# Patient Record
Sex: Male | Born: 1970 | Race: White | Hispanic: No | Marital: Single | Smoking: Current every day smoker
Health system: Southern US, Community
[De-identification: ages and names within clinical notes are randomized; demographics above are authoritative.]

## PROBLEM LIST (undated history)

## (undated) DIAGNOSIS — F1020 Alcohol dependence, uncomplicated: Secondary | ICD-10-CM

## (undated) DIAGNOSIS — F1910 Other psychoactive substance abuse, uncomplicated: Secondary | ICD-10-CM

---

## 2005-02-15 ENCOUNTER — Emergency Department (HOSPITAL_COMMUNITY): Admission: EM | Admit: 2005-02-15 | Discharge: 2005-02-16 | Payer: Self-pay | Admitting: Emergency Medicine

## 2010-01-22 ENCOUNTER — Emergency Department (HOSPITAL_COMMUNITY): Admission: EM | Admit: 2010-01-22 | Discharge: 2010-01-22 | Payer: Self-pay | Admitting: Emergency Medicine

## 2010-03-14 ENCOUNTER — Emergency Department (HOSPITAL_COMMUNITY)
Admission: EM | Admit: 2010-03-14 | Discharge: 2010-03-14 | Payer: Self-pay | Source: Home / Self Care | Admitting: Emergency Medicine

## 2010-05-07 ENCOUNTER — Emergency Department (HOSPITAL_COMMUNITY)
Admission: EM | Admit: 2010-05-07 | Discharge: 2010-05-08 | Payer: Self-pay | Source: Home / Self Care | Admitting: Emergency Medicine

## 2010-05-07 LAB — RAPID URINE DRUG SCREEN, HOSP PERFORMED
Barbiturates: NOT DETECTED
Opiates: NOT DETECTED

## 2010-05-07 LAB — CBC
MCH: 32.1 pg (ref 26.0–34.0)
MCHC: 34.6 g/dL (ref 30.0–36.0)
MCV: 92.8 fL (ref 78.0–100.0)
Platelets: 287 10*3/uL (ref 150–400)
RDW: 13.7 % (ref 11.5–15.5)
WBC: 10.5 10*3/uL (ref 4.0–10.5)

## 2010-05-07 LAB — COMPREHENSIVE METABOLIC PANEL
Albumin: 4.3 g/dL (ref 3.5–5.2)
BUN: 8 mg/dL (ref 6–23)
Calcium: 9.2 mg/dL (ref 8.4–10.5)
Creatinine, Ser: 0.89 mg/dL (ref 0.4–1.5)
Glucose, Bld: 121 mg/dL — ABNORMAL HIGH (ref 70–99)
Potassium: 4.3 mEq/L (ref 3.5–5.1)
Total Protein: 7.4 g/dL (ref 6.0–8.3)

## 2010-05-07 LAB — DIFFERENTIAL
Eosinophils Absolute: 0.2 10*3/uL (ref 0.0–0.7)
Eosinophils Relative: 2 % (ref 0–5)
Lymphs Abs: 2.8 10*3/uL (ref 0.7–4.0)
Monocytes Absolute: 0.4 10*3/uL (ref 0.1–1.0)

## 2010-05-07 LAB — CK: Total CK: 160 U/L (ref 7–232)

## 2010-05-07 LAB — SALICYLATE LEVEL: Salicylate Lvl: <4 mg/dL (ref 2.8–20.0)

## 2010-05-07 LAB — ACETAMINOPHEN LEVEL: Acetaminophen (Tylenol), Serum: <10 ug/mL — ABNORMAL LOW (ref 10–30)

## 2010-05-07 LAB — ETHANOL: Alcohol, Ethyl (B): <5 mg/dL (ref 0–10)

## 2010-12-12 ENCOUNTER — Emergency Department (HOSPITAL_COMMUNITY)
Admission: EM | Admit: 2010-12-12 | Discharge: 2010-12-12 | Disposition: A | Discharge status: Home / Self Care | Payer: Self-pay | Attending: Emergency Medicine | Admitting: Emergency Medicine

## 2010-12-12 DIAGNOSIS — K029 Dental caries, unspecified: Secondary | ICD-10-CM | POA: Insufficient documentation

## 2010-12-12 DIAGNOSIS — K089 Disorder of teeth and supporting structures, unspecified: Secondary | ICD-10-CM | POA: Insufficient documentation

## 2011-09-15 ENCOUNTER — Emergency Department: Payer: Self-pay | Admitting: *Deleted

## 2018-08-11 DIAGNOSIS — K8020 Calculus of gallbladder without cholecystitis without obstruction: Secondary | ICD-10-CM

## 2018-08-11 DIAGNOSIS — R188 Other ascites: Secondary | ICD-10-CM

## 2018-08-11 DIAGNOSIS — K7460 Unspecified cirrhosis of liver: Secondary | ICD-10-CM

## 2018-08-11 DIAGNOSIS — K759 Inflammatory liver disease, unspecified: Secondary | ICD-10-CM

## 2018-08-11 DIAGNOSIS — R17 Unspecified jaundice: Secondary | ICD-10-CM

## 2018-08-12 ENCOUNTER — Encounter (HOSPITAL_COMMUNITY): Payer: Self-pay | Admitting: Internal Medicine

## 2018-08-12 ENCOUNTER — Inpatient Hospital Stay (HOSPITAL_COMMUNITY)
Admission: EM | Admit: 2018-08-12 | Discharge: 2018-09-08 | DRG: 433 | Disposition: E | Payer: Medicaid Other | Source: Other Acute Inpatient Hospital | Attending: Family Medicine | Admitting: Family Medicine

## 2018-08-12 DIAGNOSIS — B1920 Unspecified viral hepatitis C without hepatic coma: Secondary | ICD-10-CM | POA: Diagnosis present

## 2018-08-12 DIAGNOSIS — E871 Hypo-osmolality and hyponatremia: Secondary | ICD-10-CM | POA: Diagnosis present

## 2018-08-12 DIAGNOSIS — D509 Iron deficiency anemia, unspecified: Secondary | ICD-10-CM | POA: Diagnosis not present

## 2018-08-12 DIAGNOSIS — Z781 Physical restraint status: Secondary | ICD-10-CM

## 2018-08-12 DIAGNOSIS — F1210 Cannabis abuse, uncomplicated: Secondary | ICD-10-CM | POA: Diagnosis present

## 2018-08-12 DIAGNOSIS — R188 Other ascites: Secondary | ICD-10-CM

## 2018-08-12 DIAGNOSIS — R109 Unspecified abdominal pain: Secondary | ICD-10-CM | POA: Insufficient documentation

## 2018-08-12 DIAGNOSIS — E8770 Fluid overload, unspecified: Secondary | ICD-10-CM | POA: Diagnosis not present

## 2018-08-12 DIAGNOSIS — F1510 Other stimulant abuse, uncomplicated: Secondary | ICD-10-CM | POA: Diagnosis present

## 2018-08-12 DIAGNOSIS — K7200 Acute and subacute hepatic failure without coma: Secondary | ICD-10-CM | POA: Diagnosis present

## 2018-08-12 DIAGNOSIS — F1021 Alcohol dependence, in remission: Secondary | ICD-10-CM | POA: Diagnosis present

## 2018-08-12 DIAGNOSIS — B169 Acute hepatitis B without delta-agent and without hepatic coma: Secondary | ICD-10-CM | POA: Diagnosis present

## 2018-08-12 DIAGNOSIS — K8020 Calculus of gallbladder without cholecystitis without obstruction: Secondary | ICD-10-CM | POA: Diagnosis present

## 2018-08-12 DIAGNOSIS — Z20828 Contact with and (suspected) exposure to other viral communicable diseases: Secondary | ICD-10-CM | POA: Diagnosis not present

## 2018-08-12 DIAGNOSIS — Z66 Do not resuscitate: Secondary | ICD-10-CM | POA: Diagnosis not present

## 2018-08-12 DIAGNOSIS — R17 Unspecified jaundice: Secondary | ICD-10-CM | POA: Diagnosis present

## 2018-08-12 DIAGNOSIS — D689 Coagulation defect, unspecified: Secondary | ICD-10-CM | POA: Diagnosis not present

## 2018-08-12 DIAGNOSIS — R1084 Generalized abdominal pain: Secondary | ICD-10-CM

## 2018-08-12 DIAGNOSIS — K7031 Alcoholic cirrhosis of liver with ascites: Principal | ICD-10-CM | POA: Diagnosis present

## 2018-08-12 DIAGNOSIS — K7040 Alcoholic hepatic failure without coma: Secondary | ICD-10-CM | POA: Diagnosis not present

## 2018-08-12 DIAGNOSIS — F1910 Other psychoactive substance abuse, uncomplicated: Secondary | ICD-10-CM

## 2018-08-12 DIAGNOSIS — F17210 Nicotine dependence, cigarettes, uncomplicated: Secondary | ICD-10-CM | POA: Diagnosis not present

## 2018-08-12 DIAGNOSIS — Z515 Encounter for palliative care: Secondary | ICD-10-CM | POA: Diagnosis not present

## 2018-08-12 DIAGNOSIS — K920 Hematemesis: Secondary | ICD-10-CM

## 2018-08-12 DIAGNOSIS — D892 Hypergammaglobulinemia, unspecified: Secondary | ICD-10-CM | POA: Diagnosis present

## 2018-08-12 DIAGNOSIS — K7290 Hepatic failure, unspecified without coma: Secondary | ICD-10-CM

## 2018-08-12 DIAGNOSIS — K5900 Constipation, unspecified: Secondary | ICD-10-CM | POA: Diagnosis not present

## 2018-08-12 DIAGNOSIS — R7989 Other specified abnormal findings of blood chemistry: Secondary | ICD-10-CM | POA: Diagnosis present

## 2018-08-12 DIAGNOSIS — R945 Abnormal results of liver function studies: Secondary | ICD-10-CM

## 2018-08-12 DIAGNOSIS — F17200 Nicotine dependence, unspecified, uncomplicated: Secondary | ICD-10-CM | POA: Diagnosis present

## 2018-08-12 HISTORY — DX: Alcohol dependence, uncomplicated: F10.20

## 2018-08-12 HISTORY — DX: Other psychoactive substance abuse, uncomplicated: F19.10

## 2018-08-12 LAB — IRON AND TIBC
Iron: 25 ug/dL — ABNORMAL LOW (ref 45–182)
Saturation Ratios: 7 % — ABNORMAL LOW (ref 17.9–39.5)
TIBC: 356 ug/dL (ref 250–450)
UIBC: 331 ug/dL

## 2018-08-12 LAB — CBC WITH DIFFERENTIAL/PLATELET
Abs Immature Granulocytes: 0.05 10*3/uL (ref 0.00–0.07)
Basophils Absolute: 0 10*3/uL (ref 0.0–0.1)
Basophils Relative: 0 %
Eosinophils Absolute: 0.1 10*3/uL (ref 0.0–0.5)
Eosinophils Relative: 2 %
HCT: 29.7 % — ABNORMAL LOW (ref 39.0–52.0)
Hemoglobin: 9.7 g/dL — ABNORMAL LOW (ref 13.0–17.0)
Immature Granulocytes: 1 %
Lymphocytes Relative: 21 %
Lymphs Abs: 1.4 10*3/uL (ref 0.7–4.0)
MCH: 24 pg — ABNORMAL LOW (ref 26.0–34.0)
MCHC: 32.7 g/dL (ref 30.0–36.0)
MCV: 73.3 fL — ABNORMAL LOW (ref 80.0–100.0)
Monocytes Absolute: 0.7 10*3/uL (ref 0.1–1.0)
Monocytes Relative: 11 %
Neutro Abs: 4.4 10*3/uL (ref 1.7–7.7)
Neutrophils Relative %: 65 %
Platelets: 329 10*3/uL (ref 150–400)
RBC: 4.05 MIL/uL — ABNORMAL LOW (ref 4.22–5.81)
RDW: 29.2 % — ABNORMAL HIGH (ref 11.5–15.5)
WBC: 6.7 10*3/uL (ref 4.0–10.5)
nRBC: 0 % (ref 0.0–0.2)

## 2018-08-12 LAB — VITAMIN B12: Vitamin B-12: 4716 pg/mL — ABNORMAL HIGH (ref 180–914)

## 2018-08-12 LAB — SARS CORONAVIRUS 2 BY RT PCR (HOSPITAL ORDER, PERFORMED IN ~~LOC~~ HOSPITAL LAB): SARS Coronavirus 2: NEGATIVE

## 2018-08-12 LAB — COMPREHENSIVE METABOLIC PANEL
ALT: 846 U/L — ABNORMAL HIGH (ref 0–44)
AST: 2005 U/L — ABNORMAL HIGH (ref 15–41)
Albumin: 1.9 g/dL — ABNORMAL LOW (ref 3.5–5.0)
Alkaline Phosphatase: 139 U/L — ABNORMAL HIGH (ref 38–126)
Anion gap: 6 (ref 5–15)
BUN: 10 mg/dL (ref 6–20)
CO2: 20 mmol/L — ABNORMAL LOW (ref 22–32)
Calcium: 7.9 mg/dL — ABNORMAL LOW (ref 8.9–10.3)
Chloride: 102 mmol/L (ref 98–111)
Creatinine, Ser: 0.61 mg/dL (ref 0.61–1.24)
GFR calc Af Amer: >60 mL/min (ref >60)
GFR calc non Af Amer: >60 mL/min (ref >60)
Glucose, Bld: 78 mg/dL (ref 70–99)
Potassium: 3.7 mmol/L (ref 3.5–5.1)
Sodium: 128 mmol/L — ABNORMAL LOW (ref 135–145)
Total Bilirubin: 24.8 mg/dL (ref 0.3–1.2)
Total Protein: 8.4 g/dL — ABNORMAL HIGH (ref 6.5–8.1)

## 2018-08-12 LAB — RETICULOCYTES
Immature Retic Fract: 23.3 % — ABNORMAL HIGH (ref 2.3–15.9)
RBC.: 3.96 MIL/uL — ABNORMAL LOW (ref 4.22–5.81)
Retic Count, Absolute: 177 10*3/uL (ref 19.0–186.0)
Retic Ct Pct: 4.5 % — ABNORMAL HIGH (ref 0.4–3.1)

## 2018-08-12 LAB — AMMONIA: Ammonia: 51 umol/L — ABNORMAL HIGH (ref 9–35)

## 2018-08-12 LAB — FOLATE: Folate: 14 ng/mL (ref >5.9)

## 2018-08-12 LAB — PROTIME-INR
INR: 3.2 — ABNORMAL HIGH (ref 0.8–1.2)
Prothrombin Time: 32.6 seconds — ABNORMAL HIGH (ref 11.4–15.2)

## 2018-08-12 LAB — FERRITIN: Ferritin: 26 ng/mL (ref 24–336)

## 2018-08-12 MED ORDER — MORPHINE SULFATE (PF) 2 MG/ML IV SOLN
2.0000 mg | INTRAVENOUS | Status: DC | PRN
  Administered 2018-08-12 – 2018-08-13 (×6): 2 mg via INTRAVENOUS
  Filled 2018-08-12 (×6): qty 1

## 2018-08-12 MED ORDER — SODIUM CHLORIDE 0.9 % IV SOLN
1.0000 g | INTRAVENOUS | Status: DC
  Administered 2018-08-12 – 2018-08-13 (×2): 1 g via INTRAVENOUS
  Filled 2018-08-12 (×2): qty 10

## 2018-08-12 MED ORDER — ONDANSETRON HCL 4 MG PO TABS: 4.0000 mg | ORAL_TABLET | Freq: Four times a day (QID) | ORAL | Status: DC | PRN

## 2018-08-12 MED ORDER — VITAMIN K1 10 MG/ML IJ SOLN
10.0000 mg | Freq: Once | INTRAVENOUS | Status: AC
  Administered 2018-08-12: 10 mg via INTRAVENOUS
  Filled 2018-08-12: qty 1

## 2018-08-12 MED ORDER — DOCUSATE SODIUM 100 MG PO CAPS
100.0000 mg | ORAL_CAPSULE | Freq: Two times a day (BID) | ORAL | Status: DC
  Administered 2018-08-12 – 2018-08-16 (×7): 100 mg via ORAL
  Filled 2018-08-12 (×9): qty 1

## 2018-08-12 MED ORDER — LACTULOSE 10 GM/15ML PO SOLN: 30.0000 g | Freq: Two times a day (BID) | ORAL | Status: DC | PRN

## 2018-08-12 MED ORDER — NICOTINE 14 MG/24HR TD PT24
14.0000 mg | MEDICATED_PATCH | Freq: Every day | TRANSDERMAL | Status: DC
  Administered 2018-08-12 – 2018-08-16 (×5): 14 mg via TRANSDERMAL
  Filled 2018-08-12 (×5): qty 1

## 2018-08-12 MED ORDER — ALBUMIN HUMAN 25 % IV SOLN
25.0000 g | Freq: Once | INTRAVENOUS | Status: AC
  Administered 2018-08-13: 25 g via INTRAVENOUS
  Filled 2018-08-12: qty 50

## 2018-08-12 MED ORDER — ENOXAPARIN SODIUM 40 MG/0.4ML ~~LOC~~ SOLN: 40.0000 mg | SUBCUTANEOUS | Status: DC

## 2018-08-12 MED ORDER — LACTULOSE 10 GM/15ML PO SOLN: 30.0000 g | ORAL | Status: DC

## 2018-08-12 MED ORDER — ONDANSETRON HCL 4 MG/2ML IJ SOLN
4.0000 mg | Freq: Four times a day (QID) | INTRAMUSCULAR | Status: DC | PRN
  Administered 2018-08-13 – 2018-08-15 (×3): 4 mg via INTRAVENOUS
  Filled 2018-08-12 (×3): qty 2

## 2018-08-12 NOTE — Progress Notes (Signed)
CRITICAL VALUE ALERT  Critical Value:  bilirubin total 24.8  Date & Time Notied:  Sep 06, 2018 at 10:29am  Provider Notified: MD Ophelia Charter  Orders Received/Actions taken: waiting on page

## 2018-08-12 NOTE — Consult Note (Addendum)
Referring Provider: [facility]  Primary Care Physician:  No primary care provider on file. Primary Gastroenterologist: unassigned    Reason for Consultation: decompensated cirrhosis ( new)      ASSESSMENT / PLAN:    40.  48 year old male transferred from [facility] with acute on chronic liver disease.  Ultrasound showing cirrhosis (new diagnosis for him).  Etiology of cirrhosis unclear, possibly alcohol.  Labs are pending.  Acute liver failure possibly viral, drug-induced, doubt shock liver.  Some improvement in transaminases overnight, bilirubin continues to use rise. -Tylenol level negative.  UDS positive for THC though he does smoke methamphetamines, last time was 2 weeks ago. -Await INR and acute hepatitis panel, both pending -Consider Ceruloplasmin if pending workup negative.  -I spoke with Hospitalist earlier, she will order paracentesis with fluid studies to include Gram stain, culture, albumin, total protein, cytology.  -He did present with abdominal pain, agree with empiric antibiotics for now -Mild asterixis on exam but he is alert and oriented.  Will hold off on starting lactulose given recent diarrhea.  Monitor neuro exam, monitor daily INR -eventually EGD varices screening -HCC screening: no focal lesions on U/S. Will order AFP at some point.   -U/S with dopplers of PV and HV (the one at [facility] assessed only PV)  -Can have diet from GI standpoint.   2. Microcytic Anemia, no overt GI bleeding. Hgb 9-10 range.  -obtain anemia panel.    HPI:   Mark Weeks is a 48 y.o. male transferred today from [facility] where he presented yesterday with abdominal pain, abdominal distention, nausea, vomiting, diarrhea and jaundice.   Workup at [facility]:  WBC 7.2, hemoglobin 10.1, platelets 307, MCV 75, total bilirubin 22.8, alk phos 192, AST 2534, ALT 1360, albumin 3, INR 2.7, Tylenol level less than 10, ammonia level normal.  UDS positive for THC. RUQ ultrasound  shows cirrhosis, mild perihepatic ascites, cholelithiasis, diffuse gallbladder wall thickening question chronic cholecystitis versus just being secondary to hypoalbuminemia.No biliary duct dilation. Patent PV on doppler  Patient denies history of liver disease.  He has not consumed alcohol in 6 years, no supplements/herbs at home.  No Tylenol use.  He smokes marijuana.  He smokes methamphetamines but none in 14 days he says  Patient denies any history of chronic GI symptoms.  He has never seen blood in stool, no black stools.  The GI symptoms he presented with to the ED started a few days ago.  He was not having any more than 1-2 episodes of vomiting or diarrhea a day.  He did have associated generalized abdominal pain described as intermittent.  Food no vomitus had any bearing on the pain.  Denies fevers.    PERTINENT  LABS:    Today's labs: Sodium 128, normal renal function, albumin 1.9, AST 2005, ALT 846, total bili 24.8, WBC 6.7, hemoglobin 9.7, MCV 73, platelets normal at 329   PMH: none he knows prior to this patient.. Newly diagnosed cirrhosis [place]: Family history of liver disease Home meds: none. No herbs / supplements.   Prior to Admission medications   Not on File    Current Facility-Administered Medications  Medication Dose Route Frequency Provider Last Rate Last Dose  . albumin human 25 % solution 25-50 g  25-50 g Intravenous Once Karmen Bongo, MD      . cefTRIAXone (ROCEPHIN) 1 g in sodium chloride 0.9 % 100 mL IVPB  1 g Intravenous Q24H Karmen Bongo, MD      . docusate sodium (  COLACE) capsule 100 mg  100 mg Oral BID Karmen Bongo, MD      . enoxaparin (LOVENOX) injection 40 mg  40 mg Subcutaneous Q24H Karmen Bongo, MD      . ondansetron [facility]) tablet 4 mg  4 mg Oral Q6H PRN Karmen Bongo, MD       Or  . ondansetron [facility]) injection 4 mg  4 mg Intravenous Q6H PRN Karmen Bongo, MD        Allergies as of 09/02/2018  . (Not on File)    No family  history on file.  Social History   Socioeconomic History  . Marital status: Single    Spouse name: Not on file  . Number of children: Not on file  . Years of education: Not on file  . Highest education level: Not on file  Occupational History  . Not on file  Social Needs  . Financial resource strain: Not on file  . Food insecurity:    Worry: Not on file    Inability: Not on file  . Transportation needs:    Medical: Not on file    Non-medical: Not on file  Tobacco Use  . Smoking status: Not on file  Substance and Sexual Activity  . Alcohol use: Not on file  . Drug use: Not on file  . Sexual activity: Not on file  Lifestyle  . Physical activity:    Days per week: Not on file    Minutes per session: Not on file  . Stress: Not on file  Relationships  . Social connections:    Talks on phone: Not on file    Gets together: Not on file    Attends religious service: Not on file    Active member of club or organization: Not on file    Attends meetings of clubs or organizations: Not on file    Relationship status: Not on file  . Intimate partner violence:    Fear of current or ex partner: Not on file    Emotionally abused: Not on file    Physically abused: Not on file    Forced sexual activity: Not on file  Other Topics Concern  . Not on file  Social History Narrative  . Not on file    Review of Systems: All systems reviewed and negative except where noted in HPI.  Physical Exam: Vital signs in last 24 hours: Temp:  [98.2 F (36.8 C)] 98.2 F (36.8 C) (05/05 0635) Pulse Rate:  [78] 78 (05/05 0635) Resp:  [18] 18 (05/05 0635) BP: (121)/(78) 121/78 (05/05 0635) SpO2:  [99 %] 99 % (05/05 6301)   General:   Alert, thin, jaundiced male in NAD Psych:  Pleasant, cooperative. Normal mood and affect. Eyes:  Pupils equal, icteric sclera  Ears:  Normal auditory acuity. Nose:  No deformity, discharge,  or lesions. Neck:  Supple; no masses Lungs:  Bibasilar crackles.   No  wheezes, crackles, or rhonchi.  Heart:  Regular rate and rhythm; no murmurs, 2+ BLE edema Abdomen:  Soft, mild-mod distended, nontender, BS active, no palp mass    Rectal:  Deferred  Msk:  Symmetrical without gross deformities. . Neurologic:  Alert and  oriented x4; positive asterixis otherwise grossly normal neurologically. Skin:  Intact without significant lesions or rashes.  Multiple tattoos   Intake/Output from previous day: No intake/output data recorded. Intake/Output this shift: No intake/output data recorded.  Lab Results: Recent Labs    08/21/2018 0902  WBC 6.7  HGB  9.7*  HCT 29.7*  PLT 329   BMET Recent Labs    09/05/2018 0902  NA 128*  K 3.7  CL 102  CO2 20*  GLUCOSE 78  BUN 10  CREATININE 0.61  CALCIUM 7.9*   LFT Recent Labs    09/07/2018 0902  PROT 8.4*  ALBUMIN 1.9*  AST 2,005*  ALT 846*  ALKPHOS 139*  BILITOT 24.8*   PT/INR No results for input(s): LABPROT, INR in the last 72 hours. Hepatitis Panel No results for input(s): HEPBSAG, HCVAB, HEPAIGM, HEPBIGM in the last 72 hours.   Studies/Results: No results found.   Tye Savoy, NP-C @  08/24/2018, 10:36 AM

## 2018-08-12 NOTE — Progress Notes (Signed)
Pt arrived to [address], admission paged no orders on file.

## 2018-08-12 NOTE — H&P (Addendum)
History and Physical    Mark Weeks ZOX:[id number] DOB: [birth year] DOA: 08-28-2018  PCP: No primary care provider on file.  No doctor in years. Consultants:  None Patient coming from:  Home - lives with friends; NOK: Mark Weeks, a friend, [phone]; twin sister, [phone], called to check on patient and to request an update  Chief Complaint:  Weakness  HPI: Mark Weeks is a 48 y.o. male with medical history significant of alcohol dependence, in remission, presenting to [facility] with generalized weakness.  "I just turned yellow and right here I am."  No ETOH in the last 6 years.  Occasional marijuana, denies other drugs in the last 3-4 weeks.  He was using methamphetamines prior.  He noticed jaundice about 2 weeks ago.  He is able to eat when he doesn't have pain.  He has diffuse stomach pain with bloating.  ?ascites.  No fever.  +nausea, some occasional emesis.  Diarrhea recently, two loose stools a day.  He thinks he has lost some weight.   ED Course:  RH transfer, per Dr. Mikeal Weeks:  Mark Weeks, 48 yo man with history of alcoholism who has quit now for some time presented to [facility] with generalized weakness, fatigue and has markedly elevated LFTs, US showing liver disease possibly Cirrhosis. Patient will need GI evaluation which is not available at [facility]. He will need GI consult here in am.   Review of Systems: As per HPI; otherwise review of systems reviewed and negative.   Ambulatory Status:  Ambulates without assistance  Past Medical History:  Diagnosis Date  . Alcohol dependence (HCC)    in remission x 6 years  . Polysubstance abuse (HCC)    amphetamines, marijuana    History reviewed. No pertinent surgical history.  Social History   Socioeconomic History  . Marital status: Single    Spouse name: Not on file  . Number of children: Not on file  . Years of education: Not on file  . Highest education level: Not on file  Occupational History  . Occupation: Advice worker  . Financial resource strain: Not on file  . Food insecurity:    Worry: Not on file    Inability: Not on file  . Transportation needs:    Medical: Not on file    Non-medical: Not on file  Tobacco Use  . Smoking status: Current Every Day Smoker    Packs/day: 1.00  . Smokeless tobacco: Never Used  Substance and Sexual Activity  . Alcohol use: Not Currently    Comment: h/o heavy use  . Drug use: Yes    Types: Amphetamines, Marijuana  . Sexual activity: Not on file  Lifestyle  . Physical activity:    Days per week: Not on file    Minutes per session: Not on file  . Stress: Not on file  Relationships  . Social connections:    Talks on phone: Not on file    Gets together: Not on file    Attends religious service: Not on file    Active member of club or organization: Not on file    Attends meetings of clubs or organizations: Not on file    Relationship status: Not on file  . Intimate partner violence:    Fear of current or ex partner: Not on file    Emotionally abused: Not on file    Physically abused: Not on file    Forced sexual activity: Not on file  Other Topics Concern  . Not on  file  Social History Narrative  . Not on file    Allergies not on file  History reviewed. No pertinent family history.  Prior to Admission medications   Not on File    Physical Exam: Vitals:   08/20/2018 0635 08/10/2018 1234  BP: 121/78   Pulse: 78   Resp: 18   Temp: 98.2 F (36.8 C)   TempSrc: Oral   SpO2: 99%   Weight:  79.5 kg     . General:  Appears calm and comfortable and is NAD; he is extremely jaundiced . Eyes:   EOMI, normal lids, iris; marked scleral icterus . ENT:  grossly normal hearing, lips & tongue, mmm . Neck:  no LAD, masses or thyromegaly . Cardiovascular:  RRR, no m/r/g. 2-3+ LE edema.  Marland Kitchen. Respiratory:   CTA bilaterally with no wheezes/rales/rhonchi.  Normal respiratory effort. . Abdomen: marked ascites; there is a bandaid on the LLQ that appears  to have been an attempt at paracentesis, but patient denies such attempt and I do not see evidence of this having been [facility] . Skin:  no rash or induration seen on limited exam; marked diffuse jaundice . Musculoskeletal:  grossly normal tone BUE/BLE, good ROM, no bony abnormality . Psychiatric:  blunted mood and affect, speech fluent and appropriate, AOx3 . Neurologic:  CN 2-12 grossly intact, moves all extremities in coordinated fashion, sensation intact    Radiological Exams on Admission: No results found.    Labs on Admission: I have personally reviewed the available labs and imaging studies at the time of the admission.  Pertinent labs:  Na++ 128 BUN 10/Creatinine 0.61/GFR >60 AP 139 Albumin 1.9 AST 2005/ALT 846 Bili 24.8 NH 51 WBC 6.7 Hgb 9.7 INR 3.2  [facility]:  WBC 7.2 Hgb 10.1 Platelets 307 Na++ 129 K+ 3.2 Glucose 107 AST 2534 ALT 1060 AP 192 Bili 22.8 (total) TP 9.4 Albumin 3.0 INR 2.7 Lactate 3.1, 3.0 NH4 29 UDS + for THC RUQ US: cirrhosis with ascites; cholelithiasis; no ductal diltation    Assessment/Plan Principal Problem:   Acute hepatic failure Active Problems:   Ascites   Coagulopathy (HCC)   Polysubstance abuse (HCC)   Tobacco dependence   -Patient with prior h/o ETOH dependence and persistent h/o polysubstance abuse (acknowledges amphetamines and marijuana) presenting with progressive jaundice and abdominal pain -Clearly has ascites -[facility] found to have bili elevated to 22.8, currently 24.8 -Other LFTs also elevated -Ammonia mildly elevated; will add lactulose titrated to 2-3 soft stools/day, as he reports intermittent confusion concerning for mild encephalopathy -His hepatitis discriminant function indicates that prednisolone may improve survival; will defer to GI -MELD/MELD-Na score is 32/34, with a mortality rate of 52.6% -GI has been consulted -IR has been consulted for paracentesis with  labs including cytology ordered -Additional screening and staging labs have been ordered -Will give IV Vitamin K, as suggested by GI -Rocephin for empiric coverage of SBP for now -Tobacco Dependence: encourage cessation.    -Nicotine patch ordered -Obviously needs cessation from all substances.    Note: This patient has been tested and his test is pending for the novel coronavirus COVID-19.  DVT prophylaxis:   SCDs Code Status:  DNR - confirmed with patient Family Communication:  None present Disposition Plan:  Home once clinically improved Consults called: GI  Admission status: Admit - It is my clinical opinion that admission to INPATIENT is reasonable and necessary because of the expectation that this patient will require  hospital care that crosses at least 2 midnights to treat this condition based on the medical complexity of the problems presented.  Given the aforementioned information, the predictability of an adverse outcome is felt to be significant.     Jonah Blue MD [facility]   How to contact the [facility] Attending or Consulting provider 7A - 7P or covering provider during after hours 7P -7A, for this patient?  1. Check the care team in [facility] (1-Rh) and look for a) attending/consulting [facility] provider listed and b) the [facility] team listed 2. Log into [email or web address] and use [place]'s universal password to access. If you do not have the password, please contact the hospital operator. 3. Locate the [facility] provider you are looking for under [facility] and page to a number that you can be directly reached. 4. If you still have difficulty reaching the provider, please page the [facility] (Director on Call) for the Hospitalists listed on amion for assistance.   2018/09/10, 2:00 PM

## 2018-08-13 ENCOUNTER — Other Ambulatory Visit: Payer: Self-pay

## 2018-08-13 ENCOUNTER — Inpatient Hospital Stay (HOSPITAL_COMMUNITY): Payer: Medicaid Other

## 2018-08-13 ENCOUNTER — Encounter (HOSPITAL_COMMUNITY): Payer: Self-pay | Admitting: Physician Assistant

## 2018-08-13 HISTORY — PX: IR PARACENTESIS: IMG2679

## 2018-08-13 LAB — BODY FLUID CELL COUNT WITH DIFFERENTIAL
Lymphs, Fluid: 33 %
Monocyte-Macrophage-Serous Fluid: 59 % (ref 50–90)
Neutrophil Count, Fluid: 8 % (ref 0–25)
Total Nucleated Cell Count, Fluid: 146 cu mm (ref 0–1000)

## 2018-08-13 LAB — IGG, IGA, IGM
IgA: 738 mg/dL — ABNORMAL HIGH (ref 90–386)
IgG (Immunoglobin G), Serum: 4010 mg/dL — ABNORMAL HIGH (ref 603–1613)
IgM (Immunoglobulin M), Srm: 801 mg/dL — ABNORMAL HIGH (ref 20–172)

## 2018-08-13 LAB — CBC
HCT: 27.7 % — ABNORMAL LOW (ref 39.0–52.0)
Hemoglobin: 9.1 g/dL — ABNORMAL LOW (ref 13.0–17.0)
MCH: 23.8 pg — ABNORMAL LOW (ref 26.0–34.0)
MCHC: 32.9 g/dL (ref 30.0–36.0)
MCV: 72.3 fL — ABNORMAL LOW (ref 80.0–100.0)
Platelets: 301 10*3/uL (ref 150–400)
RBC: 3.83 MIL/uL — ABNORMAL LOW (ref 4.22–5.81)
RDW: 29.6 % — ABNORMAL HIGH (ref 11.5–15.5)
WBC: 6.9 10*3/uL (ref 4.0–10.5)
nRBC: 0 % (ref 0.0–0.2)

## 2018-08-13 LAB — BASIC METABOLIC PANEL
Anion gap: 8 (ref 5–15)
BUN: 14 mg/dL (ref 6–20)
CO2: 19 mmol/L — ABNORMAL LOW (ref 22–32)
Calcium: 7.9 mg/dL — ABNORMAL LOW (ref 8.9–10.3)
Chloride: 98 mmol/L (ref 98–111)
Creatinine, Ser: 0.66 mg/dL (ref 0.61–1.24)
GFR calc Af Amer: >60 mL/min (ref >60)
GFR calc non Af Amer: >60 mL/min (ref >60)
Glucose, Bld: 101 mg/dL — ABNORMAL HIGH (ref 70–99)
Potassium: 4.2 mmol/L (ref 3.5–5.1)
Sodium: 125 mmol/L — ABNORMAL LOW (ref 135–145)

## 2018-08-13 LAB — EPSTEIN-BARR VIRUS VCA, IGM: EBV VCA IgM: 154 U/mL — ABNORMAL HIGH (ref 0.0–35.9)

## 2018-08-13 LAB — HEPATIC FUNCTION PANEL
ALT: 731 U/L — ABNORMAL HIGH (ref 0–44)
AST: 1791 U/L — ABNORMAL HIGH (ref 15–41)
Albumin: 1.8 g/dL — ABNORMAL LOW (ref 3.5–5.0)
Alkaline Phosphatase: 131 U/L — ABNORMAL HIGH (ref 38–126)
Bilirubin, Direct: 14.4 mg/dL — ABNORMAL HIGH (ref 0.0–0.2)
Indirect Bilirubin: 9.3 mg/dL — ABNORMAL HIGH (ref 0.3–0.9)
Total Bilirubin: 23.7 mg/dL (ref 0.3–1.2)
Total Protein: 8.2 g/dL — ABNORMAL HIGH (ref 6.5–8.1)

## 2018-08-13 LAB — HEPATITIS PANEL, ACUTE
HCV Ab: >11 s/co ratio — ABNORMAL HIGH (ref 0.0–0.9)
Hep A IgM: NEGATIVE
Hep B C IgM: POSITIVE — AB
Hepatitis B Surface Ag: POSITIVE — AB

## 2018-08-13 LAB — GRAM STAIN

## 2018-08-13 LAB — HIV ANTIBODY (ROUTINE TESTING W REFLEX): HIV Screen 4th Generation wRfx: NONREACTIVE

## 2018-08-13 LAB — CMV IGM: CMV IgM: 48.8 AU/mL — ABNORMAL HIGH (ref 0.0–29.9)

## 2018-08-13 LAB — CERULOPLASMIN: Ceruloplasmin: 28.8 mg/dL (ref 16.0–31.0)

## 2018-08-13 LAB — PROTEIN, PLEURAL OR PERITONEAL FLUID: Total protein, fluid: <3 g/dL

## 2018-08-13 LAB — PROTIME-INR
INR: 3.4 — ABNORMAL HIGH (ref 0.8–1.2)
Prothrombin Time: 33.6 seconds — ABNORMAL HIGH (ref 11.4–15.2)

## 2018-08-13 LAB — HSV(HERPES SIMPLEX VRS) I + II AB-IGM: HSVI/II Comb IgM: 1.8 Ratio — ABNORMAL HIGH (ref 0.00–0.90)

## 2018-08-13 LAB — ALBUMIN, PLEURAL OR PERITONEAL FLUID: Albumin, Fluid: <1 g/dL

## 2018-08-13 MED ORDER — OXYCODONE HCL 5 MG PO TABS
10.0000 mg | ORAL_TABLET | Freq: Once | ORAL | Status: AC
  Administered 2018-08-13: 10 mg via ORAL
  Filled 2018-08-13: qty 2

## 2018-08-13 MED ORDER — VITAMIN K1 10 MG/ML IJ SOLN
10.0000 mg | Freq: Once | INTRAVENOUS | Status: AC
  Administered 2018-08-13: 10 mg via INTRAVENOUS
  Filled 2018-08-13: qty 1

## 2018-08-13 MED ORDER — LIDOCAINE HCL 1 % IJ SOLN
INTRAMUSCULAR | Status: AC
  Filled 2018-08-13: qty 20

## 2018-08-13 MED ORDER — TRAMADOL HCL 50 MG PO TABS
50.0000 mg | ORAL_TABLET | Freq: Two times a day (BID) | ORAL | Status: DC | PRN
  Administered 2018-08-13 – 2018-08-14 (×2): 50 mg via ORAL
  Filled 2018-08-13 (×2): qty 1

## 2018-08-13 MED ORDER — LIDOCAINE HCL (PF) 1 % IJ SOLN
INTRAMUSCULAR | Status: DC | PRN
  Administered 2018-08-13: 5 mL

## 2018-08-13 MED ORDER — SODIUM CHLORIDE 0.9 % IV SOLN
INTRAVENOUS | Status: DC | PRN
  Administered 2018-08-16: 500 mL via INTRAVENOUS

## 2018-08-13 NOTE — Progress Notes (Signed)
     Progress Note    ASSESSMENT AND PLAN:   66. 48 year old male with newly diagnosed cirrhosis with superimposed acute HBV.   Cirrhosis possibly secondary to HCV / ETOH. Liver tests improved overnight with exception of INR 3.2 >>> 3.4 -Ceruloplasmin if pending workup negative. -CMV IgM mildly elevated at 48 ( ? Cross reactivity) -HIV negative -Obtain HCV PCR  And HBV DNA quant -Continue to monitor INR -For diagnosic paracentesis with fluid studies to include Gram stain, culture, albumin, total protein, cytology.  -eventually EGD varices screening -HCC screening: no focal lesions on U/S. Will order AFP at some point.   -awaiting U/S with dopplers of PV and HV (the one at [facility] assessed only PV)  -no further diarrhea so will start low dose of lactulose as he has asterixis on exam    2. Microcytic Anemia, no overt GI bleeding. Hgb 9-10 range.  -Very possible that he is iron deficient with ferritin of 26, TIBC 356, 7% sat.  -Hgb 9.1, down from 9.7 today. No overt bleeding.  -B12 , folate normal.  -eventual EGD / colonoscopy   3. Abnormal protein electrophoresis.    SUBJECTIVE   Still has generalized abdomina pain.  Fatigue.     OBJCTIVE:     Vital signs in last 24 hours: Temp:  [97.6 F (36.4 C)-98.2 F (36.8 C)] 98.2 F (36.8 C) (05/06 0455) Pulse Rate:  [84-87] 84 (05/06 0455) Resp:  [18] 18 (05/05 1432) BP: (105-128)/(58-78) 128/77 (05/06 0455) SpO2:  [95 %-97 %] 97 % (05/06 0455) Weight:  [79.5 kg] 79.5 kg (05/05 1234) Last BM Date: 08/18/18 General:   Alert male in NAD Heart:  Regular rate and rhythm; no murmur, 1+ BLE edema   Pulm: Normal respiratory effor Abdomen:  Soft, nondistended, nontender.  Normal bowel sounds       Neurologic:  Alert and  oriented x4;  + asterixis. Psych:  cooperative.  Normal mood and affect.   Intake/Output from previous day: 05/05 0701 - 05/06 0700 In: 390 [P.O.:240; IV Piggyback:150] Out: 650 [Urine:650] Intake/Output  this shift: Total I/O In: -  Out: 200 [Urine:200]  Lab Results: Recent Labs    2018/08/19 0902 08/13/18 0306  WBC 6.7 6.9  HGB 9.7* 9.1*  HCT 29.7* 27.7*  PLT 329 301   BMET Recent Labs    08-19-18 0902 08/13/18 0306  NA 128* 125*  K 3.7 4.2  CL 102 98  CO2 20* 19*  GLUCOSE 78 101*  BUN 10 14  CREATININE 0.61 0.66  CALCIUM 7.9* 7.9*   LFT Recent Labs    08/13/18 0306  PROT 8.2*  ALBUMIN 1.8*  AST 1,791*  ALT 731*  ALKPHOS 131*  BILITOT 23.7*  BILIDIR 14.4*  IBILI 9.3*   PT/INR Recent Labs    08/19/18 1040 08/13/18 0306  LABPROT 32.6* 33.6*  INR 3.2* 3.4*   Hepatitis Panel Recent Labs    2018/08/19 0902  HEPBSAG Positive*  HCVAB >11.0*  HEPAIGM Negative  HEPBIGM Positive*    No results found.  Principal Problem:   Acute hepatic failure Active Problems:   Elevated LFTs   Ascites   Coagulopathy (HCC)   Polysubstance abuse (HCC)   Tobacco dependence     LOS: 1 day   Willette Cluster ,NP 08/13/2018, 9:15 AM

## 2018-08-13 NOTE — Procedures (Signed)
PROCEDURE SUMMARY:  Successful US guided paracentesis from left lateral abdomen.  Yielded 3.75 L of clear yellow fluid.  No immediate complications.  Patient tolerated well.  EBL = trace  Specimen was sent for labs.  Sione Baumgarten S Raelynne Ludwick PA-C 08/13/2018 12:02 PM

## 2018-08-13 NOTE — Progress Notes (Signed)
PROGRESS NOTE    Mark EasternJohnny Weeks  WUJ:[id number] DOB: Dec 13, 1970 DOA: 08/11/2018 PCP: No primary care provider on file.      Brief Narrative:  Mr. Mark Weeks is a 48 y.o. M with hx EtOH abuse and IVDU who presents with 1-2 weeks progressive jaundice and abdominal swelling.      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  Also, Hep C Ab positive, Hep B SAg positive.  CMV IgM positive.  HIV negative.  Wilson's ruled out.  Paraproteinemia, GI are working up concurrent autoimmune hepatitis.    MELD extremely high, 34.  Paracentesis today with few nucleated cells. -Stop ceftriaxone -Follow up SPEP, FLC assay -Consult GI, appreciate cares   Coagulopathy INR increased today -Repeat vitamin K  Hepatic encephalopathy Mild slowing today. -Continue lactulose  Microcytic anemia Ferritin low.  Suspect chronic GI blood loss.  No clinical bleeding. -Start oral iron  Hepatitis B infection -Consult GI, they may start entecavir  Hepatitis C Ab positive -Follow Hep C RNA  Substance use disorder HIV negative  Hyponatremia Stable, hypervolemic.   SARS-CoV-2 testing was obtained for screening purposes.  COVID ruled out.         MDM and disposition: The below labs and imaging reports were reviewed and summarized above.  Medication management as above.  The patient was admitted with decompensated hepatic cirrhosis.  This is an acute illness and decompensation of his chronic disease that poses threat to life, with approximately 50% 6 month mortality.          DVT prophylaxis: SCDs Code Status: FULL Family Communication: None     Consultants:   GI  IR  Procedures:   Paracentesis 5/6  Antimicrobials:   Ceftriaxone 5/5>>5/6    Subjective: Abdominal pain, diffuse more on left.  No vomiting.  Jaundice severe.  No bleeding or hematochezia.  No melena. No passing out,  syncope, dyspnea.  No chest pain.    Objective: Vitals:   2018/05/13 1432 2018/05/13 2128 08/13/18 0455 08/13/18 1239  BP: (!) 105/58 124/78 128/77 113/71  Pulse: 87 85 84 89  Resp: 18   18  Temp: 97.7 F (36.5 C) 97.6 F (36.4 C) 98.2 F (36.8 C) 98.2 F (36.8 C)  TempSrc: Oral Oral Oral Oral  SpO2: 96% 95% 97% 94%  Weight:        Intake/Output Summary (Last 24 hours) at 08/13/2018 1541 Last data filed at 08/13/2018 1500 Gross per 24 hour  Intake 390 ml  Output 425 ml  Net -35 ml   Filed Weights   2018/05/13 1234  Weight: 79.5 kg    Examination: General appearance:  adult male, alert and in no acute distress.  Appears tired, sitting in bed HEENT: Anicteric, conjunctiva pink, lids and lashes normal. No nasal deformity, discharge, epistaxis.  Lips dry, dentition poor, OP very dry, no oral lesions, hearing normal.   Skin: Warm and dry.  Marked jaundice.  No suspicious rashes or lesions. Cardiac: RRR, nl S1-S2, no murmurs appreciated.  Capillary refill is brisk.  JVP not visible.  No LE edema.  Radial pulses 2+ and symmetric. Respiratory: Normal respiratory rate and rhythm.  CTAB without rales or wheezes. Abdomen: Abdomen soft.  Mild left sided TTP without gaurding.  Ascitser improved.   MSK: No deformities or effusions. Neuro: Awake and alert.  EOMI, moves all extremities. Speech fluent.    Psych: Sensorium intact and responding to questions, attention diminihsed, mild  psychomotor slowing.  No asterexis.   Affect blunted.  Judgment and insight appear mildly impaired.    Data Reviewed: I have personally reviewed following labs and imaging studies:  CBC: Recent Labs  Lab 2018/08/26 0902 08/13/18 0306  WBC 6.7 6.9  NEUTROABS 4.4  --   HGB 9.7* 9.1*  HCT 29.7* 27.7*  MCV 73.3* 72.3*  PLT 329 301   Basic Metabolic Panel: Recent Labs  Lab Aug 26, 2018 0902 08/13/18 0306  NA 128* 125*  K 3.7 4.2  CL 102 98  CO2 20* 19*  GLUCOSE 78 101*  BUN 10 14  CREATININE 0.61 0.66   CALCIUM 7.9* 7.9*   GFR: CrCl cannot be calculated (Unknown ideal weight.). Liver Function Tests: Recent Labs  Lab Aug 26, 2018 0902 08/13/18 0306  AST 2,005* 1,791*  ALT 846* 731*  ALKPHOS 139* 131*  BILITOT 24.8* 23.7*  PROT 8.4* 8.2*  ALBUMIN 1.9* 1.8*   No results for input(s): LIPASE, AMYLASE in the last 168 hours. Recent Labs  Lab 08/26/2018 1040  AMMONIA 51*   Coagulation Profile: Recent Labs  Lab 2018-08-26 1040 08/13/18 0306  INR 3.2* 3.4*   Cardiac Enzymes: No results for input(s): CKTOTAL, CKMB, CKMBINDEX, TROPONINI in the last 168 hours. BNP (last 3 results) No results for input(s): PROBNP in the last 8760 hours. HbA1C: No results for input(s): HGBA1C in the last 72 hours. CBG: No results for input(s): GLUCAP in the last 168 hours. Lipid Profile: No results for input(s): CHOL, HDL, LDLCALC, TRIG, CHOLHDL, LDLDIRECT in the last 72 hours. Thyroid Function Tests: No results for input(s): TSH, T4TOTAL, FREET4, T3FREE, THYROIDAB in the last 72 hours. Anemia Panel: Recent Labs    2018/08/26 1149  VITAMINB12 4,716*  FOLATE 14.0  FERRITIN 26  TIBC 356  IRON 25*  RETICCTPCT 4.5*   Urine analysis: No results found for: COLORURINE, APPEARANCEUR, LABSPEC, PHURINE, GLUCOSEU, HGBUR, BILIRUBINUR, KETONESUR, PROTEINUR, UROBILINOGEN, NITRITE, LEUKOCYTESUR Sepsis Labs: (procalcitonin:4,lacticacidven:4)  ) Recent Results (from the past 240 hour(s))  SARS Coronavirus 2 (CEPHEID - Performed in [facility] hospital lab), Hosp Order     Status: None   Collection Time: 08-26-18  7:56 AM  Result Value Ref Range Status   SARS Coronavirus 2 NEGATIVE NEGATIVE Final    Comment: (NOTE) If result is NEGATIVE SARS-CoV-2 target nucleic acids are NOT DETECTED. The SARS-CoV-2 RNA is generally detectable in upper and lower  respiratory specimens during the acute phase of infection. The lowest  concentration of SARS-CoV-2 viral copies this assay can detect is 250  copies  / mL. A negative result does not preclude SARS-CoV-2 infection  and should not be used as the sole basis for treatment or other  patient management decisions.  A negative result may occur with  improper specimen collection / handling, submission of specimen other  than nasopharyngeal swab, presence of viral mutation(s) within the  areas targeted by this assay, and inadequate number of viral copies  (<250 copies / mL). A negative result must be combined with clinical  observations, patient history, and epidemiological information. If result is POSITIVE SARS-CoV-2 target nucleic acids are DETECTED. The SARS-CoV-2 RNA is generally detectable in upper and lower  respiratory specimens dur ing the acute phase of infection.  Positive  results are indicative of active infection with SARS-CoV-2.  Clinical  correlation with patient history and other diagnostic information is  necessary to determine patient infection status.  Positive results do  not rule out bacterial infection or co-infection with other viruses. If result is  PRESUMPTIVE POSTIVE SARS-CoV-2 nucleic acids MAY BE PRESENT.   A presumptive positive result was obtained on the submitted specimen  and confirmed on repeat testing.  While 2019 novel coronavirus  (SARS-CoV-2) nucleic acids may be present in the submitted sample  additional confirmatory testing may be necessary for epidemiological  and / or clinical management purposes  to differentiate between  SARS-CoV-2 and other Sarbecovirus currently known to infect humans.  If clinically indicated additional testing with an alternate test  methodology [phone]) is advised. The SARS-CoV-2 RNA is generally  detectable in upper and lower respiratory sp ecimens during the acute  phase of infection. The expected result is Negative. Fact Sheet for Patients:  BoilerBrush.com.cy Fact Sheet for Healthcare Providers: https://pope.com/ This test  is not yet approved or cleared by the [place] FDA and has been authorized for detection and/or diagnosis of SARS-CoV-2 by FDA under an Emergency Use Authorization (EUA).  This EUA will remain in effect (meaning this test can be used) for the duration of the COVID-19 declaration under Section 564(b)(1) of the Act, 21 U.S.C. section 360bbb-3(b)(1), unless the authorization is terminated or revoked sooner. Performed at [facility], [address] [place], [place] [address]   Gram stain     Status: None   Collection Time: 08/13/18 11:29 AM  Result Value Ref Range Status   Specimen Description PERITONEAL CAVITY  Final   Special Requests NONE  Final   Gram Stain   Final    CYTOSPIN SMEAR WBC PRESENT, PREDOMINANTLY MONONUCLEAR NO ORGANISMS SEEN Performed at [facility], [address] [place], [place] [address]    Report Status 08/13/2018 FINAL  Final         Radiology Studies: US Liver Doppler  Result Date: 08/13/2018 CLINICAL DATA:  Cirrhosis, ascites and hepatic failure. EXAM: DUPLEX ULTRASOUND OF LIVER TECHNIQUE: Color and duplex Doppler ultrasound was performed to evaluate the hepatic in-flow and out-flow vessels. COMPARISON:  Right upper quadrant abdominal ultrasound on 08/11/2018 at [facility] FINDINGS: Portal Vein Velocities Main:  30 cm/sec Right:  19 cm/sec Left:  24 cm/sec There is no evidence of portal vein thrombus. Direction of portal vein flow is in a normal direction towards the liver. Waveforms are unremarkable and there is no significant reduction in portal vein velocities. Hepatic Vein Velocities Right:  51 cm/sec Middle:  43 cm/sec Left:  58 cm/sec No evidence of hepatic veno-occlusive disease. Hepatic vein waveforms and velocities are normal. Hepatic Artery Velocity:  186 cm/sec Splenic Vein Velocity: 73 cm/sec. No evidence of splenic vein thrombosis. Varices: No visualized varices. Ascites: Ascites present. The spleen is moderately enlarged  with estimated volume of 602 mL. The liver again demonstrates evidence of cirrhosis without focal mass or evidence of intrahepatic biliary ductal dilatation. IMPRESSION: 1. Patent portal vein with normal direction of flow and no significant reduction in velocities. 2. Normally patent hepatic veins. 3. Moderate splenomegaly with estimated volume of 602 mL. 4. Evidence of cirrhosis without evidence of hepatic mass or intrahepatic biliary ductal dilatation by ultrasound. 5. Ascites. Electronically Signed   By: Irish Lack M.D.   On: 08/13/2018 13:10   Ir Paracentesis  Result Date: 08/13/2018 INDICATION: Ascites secondary to alcoholic cirrhosis and hepatitis C infection. Request for diagnostic and therapeutic paracentesis. EXAM: ULTRASOUND GUIDED PARACENTESIS MEDICATIONS: 1% lidocaine 10 mL COMPLICATIONS: None immediate. PROCEDURE: Informed written consent was obtained from the patient after a discussion of the risks, benefits and alternatives to treatment. A timeout was performed prior to  the initiation of the procedure. Initial ultrasound scanning demonstrates a large amount of ascites within the left lower abdominal quadrant. The left lower abdomen was prepped and draped in the usual sterile fashion. 1% lidocaine with epinephrine was used for local anesthesia. Following this, a 19 gauge, 7-cm, Yueh catheter was introduced. An ultrasound image was saved for documentation purposes. The paracentesis was performed. The catheter was removed and a dressing was applied. The patient tolerated the procedure well without immediate post procedural complication. FINDINGS: A total of approximately 3.75 L of clear yellow fluid was removed. Samples were sent to the laboratory as requested by the clinical team. IMPRESSION: Successful ultrasound-guided paracentesis yielding 3.75 liters of peritoneal fluid. Read by: Corrin Parker, PA-C Electronically Signed   By: Judie Petit.  Shick M.D.   On: 08/13/2018 12:00        Scheduled Meds:  . docusate sodium  100 mg Oral BID  . lidocaine      . nicotine  14 mg Transdermal Daily   Continuous Infusions: . cefTRIAXone (ROCEPHIN)  IV 1 g (08/13/18 0848)     LOS: 1 day    Time spent: 35 minutes    Alberteen Sam, MD [facility] 08/13/2018, 3:41 PM     Please page through AMION:  [email or web address] If 7PM-7AM, please contact night-coverage

## 2018-08-14 DIAGNOSIS — R768 Other specified abnormal immunological findings in serum: Secondary | ICD-10-CM

## 2018-08-14 DIAGNOSIS — R188 Other ascites: Secondary | ICD-10-CM

## 2018-08-14 DIAGNOSIS — K7460 Unspecified cirrhosis of liver: Secondary | ICD-10-CM

## 2018-08-14 DIAGNOSIS — F1021 Alcohol dependence, in remission: Secondary | ICD-10-CM

## 2018-08-14 DIAGNOSIS — F17200 Nicotine dependence, unspecified, uncomplicated: Secondary | ICD-10-CM

## 2018-08-14 DIAGNOSIS — B1910 Unspecified viral hepatitis B without hepatic coma: Secondary | ICD-10-CM

## 2018-08-14 DIAGNOSIS — K7200 Acute and subacute hepatic failure without coma: Secondary | ICD-10-CM

## 2018-08-14 DIAGNOSIS — F1510 Other stimulant abuse, uncomplicated: Secondary | ICD-10-CM

## 2018-08-14 LAB — BASIC METABOLIC PANEL
Anion gap: 6 (ref 5–15)
BUN: 16 mg/dL (ref 6–20)
CO2: 21 mmol/L — ABNORMAL LOW (ref 22–32)
Calcium: 8 mg/dL — ABNORMAL LOW (ref 8.9–10.3)
Chloride: 99 mmol/L (ref 98–111)
Creatinine, Ser: 0.58 mg/dL — ABNORMAL LOW (ref 0.61–1.24)
GFR calc Af Amer: >60 mL/min (ref >60)
GFR calc non Af Amer: >60 mL/min (ref >60)
Glucose, Bld: 88 mg/dL (ref 70–99)
Potassium: 4.1 mmol/L (ref 3.5–5.1)
Sodium: 126 mmol/L — ABNORMAL LOW (ref 135–145)

## 2018-08-14 LAB — ANTI-SMOOTH MUSCLE ANTIBODY, IGG: F-Actin IgG: 34 Units — ABNORMAL HIGH (ref 0–19)

## 2018-08-14 LAB — PROTIME-INR
INR: 3.4 — ABNORMAL HIGH (ref 0.8–1.2)
Prothrombin Time: 33.9 seconds — ABNORMAL HIGH (ref 11.4–15.2)

## 2018-08-14 LAB — HEPATIC FUNCTION PANEL
ALT: 642 U/L — ABNORMAL HIGH (ref 0–44)
AST: 1466 U/L — ABNORMAL HIGH (ref 15–41)
Albumin: 2 g/dL — ABNORMAL LOW (ref 3.5–5.0)
Alkaline Phosphatase: 116 U/L (ref 38–126)
Bilirubin, Direct: 15.8 mg/dL — ABNORMAL HIGH (ref 0.0–0.2)
Indirect Bilirubin: 9.6 mg/dL — ABNORMAL HIGH (ref 0.3–0.9)
Total Bilirubin: 25.4 mg/dL (ref 0.3–1.2)
Total Protein: 8.2 g/dL — ABNORMAL HIGH (ref 6.5–8.1)

## 2018-08-14 LAB — CBC
HCT: 25.8 % — ABNORMAL LOW (ref 39.0–52.0)
Hemoglobin: 8.4 g/dL — ABNORMAL LOW (ref 13.0–17.0)
MCH: 23.7 pg — ABNORMAL LOW (ref 26.0–34.0)
MCHC: 32.6 g/dL (ref 30.0–36.0)
MCV: 72.9 fL — ABNORMAL LOW (ref 80.0–100.0)
Platelets: 297 10*3/uL (ref 150–400)
RBC: 3.54 MIL/uL — ABNORMAL LOW (ref 4.22–5.81)
RDW: 30 % — ABNORMAL HIGH (ref 11.5–15.5)
WBC: 7.1 10*3/uL (ref 4.0–10.5)
nRBC: 0 % (ref 0.0–0.2)

## 2018-08-14 LAB — HEPATITIS B E ANTIBODY: Hep B E Ab: NEGATIVE

## 2018-08-14 LAB — HEPATITIS B E ANTIGEN: Hep B E Ag: POSITIVE — AB

## 2018-08-14 LAB — ANTI-MICROSOMAL ANTIBODY LIVER / KIDNEY: LKM1 Ab: 3.7 Units (ref 0.0–20.0)

## 2018-08-14 MED ORDER — FERROUS SULFATE 325 (65 FE) MG PO TABS
325.0000 mg | ORAL_TABLET | Freq: Two times a day (BID) | ORAL | Status: DC
  Administered 2018-08-14 – 2018-08-16 (×4): 325 mg via ORAL
  Filled 2018-08-14 (×4): qty 1

## 2018-08-14 MED ORDER — OXYCODONE HCL 5 MG PO TABS
5.0000 mg | ORAL_TABLET | Freq: Three times a day (TID) | ORAL | Status: DC | PRN
  Administered 2018-08-14 – 2018-08-16 (×5): 5 mg via ORAL
  Filled 2018-08-14 (×5): qty 1

## 2018-08-14 MED ORDER — LACTULOSE 10 GM/15ML PO SOLN
10.0000 g | Freq: Three times a day (TID) | ORAL | Status: DC
  Administered 2018-08-14 – 2018-08-15 (×4): 10 g via ORAL
  Filled 2018-08-14 (×4): qty 15

## 2018-08-14 MED ORDER — FUROSEMIDE 40 MG PO TABS
40.0000 mg | ORAL_TABLET | Freq: Every day | ORAL | Status: DC
  Administered 2018-08-14 – 2018-08-16 (×3): 40 mg via ORAL
  Filled 2018-08-14 (×3): qty 1

## 2018-08-14 MED ORDER — SPIRONOLACTONE 25 MG PO TABS
25.0000 mg | ORAL_TABLET | Freq: Every day | ORAL | Status: DC
  Administered 2018-08-14 – 2018-08-15 (×2): 25 mg via ORAL
  Filled 2018-08-14 (×2): qty 1

## 2018-08-14 NOTE — Progress Notes (Signed)
PROGRESS NOTE    Mark Weeks  ZOX:[id number] DOB: 27-Jan-1971 DOA: 08/24/18 PCP: No primary care provider on file.      Brief Narrative:  Mark Weeks is a 49 y.o. M with hx EtOH abuse and IVDU who presents with 1-2 weeks progressive jaundice and abdominal swelling.      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  Also, Hep C Ab positive, Hep B SAg positive.  CMV IgM positive.  HIV negative.  Wilson's ruled out.  Paraproteinemia, GI are working up concurrent autoimmune hepatitis.    MELD extremely high, 34.  Paracentesis 5/6 ruled out SBP, showed extremely low protein/albumin content, consistent with portal HTN.  Liver US appears cirrhotic, no masses, normal portal vein. -Consult GI, appreciate cares -Follow up SPEP, FLC assay -Start Lasix and spironolactone -EGD per GI, not urgent    Positive CMV, EBV, HSV IgM -Consult ID to comment on significance of IgM  Coagulopathy INR no change.  No clinical bleeding.  Hepatic encephalopathy No change.  Mild slowing.  Vomiting today. -Continue lactulose  Microcytic anemia Ferritin low.  Suspect chronic GI blood loss.  No clinical bleeding. -Continue oral iron  Hepatitis B infection -Consult GI and ID  Hepatitis C Ab positive -Consult ID  Substance use disorder HIV negative  Hyponatremia No significant change, hypervolemic.   SARS-CoV-2 testing was obtained for screening purposes.  COVID ruled out.         MDM and disposition: Below labs and imaging reports reviewed and summarized above.  Medication management as above.  The patient was admitted with decompensated hepatic cirrhosis.  This is an acute illness and decompensation of his chronic disease that poses threat to life, with approximately 50% 6 month mortality.  He has had no significant improvement.  We will engage infectious disease.  Case was discussed with  gastroenterology.  An extensive complexity of data was involved in decision-making.      DVT prophylaxis: SCDs Code Status: FULL Family Communication: None     Consultants:   GI  IR  Procedures:   Paracentesis 5/6  Antimicrobials:   Ceftriaxone 5/5>>5/6    Subjective: Still having severe abdominal pain, more on the left.  Vomiting today.  Jaundice is persistent.  No bleeding of the gums, rectal bleeding, or melena.  No dyspnea, dyspnea, chest pain.       Objective: Vitals:   08/13/18 1239 08/13/18 2004 08/13/18 2113 08/14/18 0526  BP: 113/71  116/66 116/71  Pulse: 89  87 91  Resp: Temp: 98.2 F (36.8 C)  97.7 F (36.5 C) 97.6 F (36.4 C)  TempSrc: Oral  Oral Oral  SpO2: 94%  94% 96%  Weight:      Height:   (1.651 m)      Intake/Output Summary (Last 24 hours) at 08/14/2018 1430 Last data filed at 08/14/2018 8119 Gross per 24 hour  Intake --  Output 700 ml  Net -700 ml   Filed Weights   08-24-18 1234  Weight: 79.5 kg    Examination: General appearance: Tired adult male, sitting in recliner, appears very tired and sick. HEENT: Severe icterus, conjunctival pink, lids and lashes normal.  No nasal deformity, discharge, or epistaxis.  Lips dry, dentition poor, oropharynx dry, no oral lesions, hearing normal. Skin: Jaundice, skin warm and dry, no suspicious rashes or lesions. Cardiac: Tachycardic, loud S2, JVP normal, mild nonpitting lower extremity edema.  Respiratory: Respiratory effort weak, lungs clear without rales or wheezes, lung sounds diminished overall. Abdomen: Diffuse abdominal pain, voluntary guarding, ascites is worse, more tense.   MSK: No deformities or effusions. Neuro: Wake and alert but groggy, extraocular movements intact, moves all extremities, speech slurred, appears tired. Psych: Sensorium intact responding to questions, oriented to person, place, and time.  Attention diminished, psychomotor slowing noted, no asterixis, affect  blunted, judgment and insight appear mildly impaired.    Data Reviewed: I have personally reviewed following labs and imaging studies:  CBC: Recent Labs  Lab 09/04/2018 0902 08/13/18 0306 08/14/18 0340  WBC 6.7 6.9 7.1  NEUTROABS 4.4  --   --   HGB 9.7* 9.1* 8.4*  HCT 29.7* 27.7* 25.8*  MCV 73.3* 72.3* 72.9*  PLT 329 301 297   Basic Metabolic Panel: Recent Labs  Lab 08/14/2018 0902 08/13/18 0306 08/14/18 0340  NA 128* 125* 126*  K 3.7 4.2 4.1  CL 102 98 99  CO2 20* 19* 21*  GLUCOSE 78 101* 88  BUN 10 14 16   CREATININE 0.61 0.66 0.58*  CALCIUM 7.9* 7.9* 8.0*   GFR: Estimated Creatinine Clearance: 108.5 mL/min (A) (by C-G formula based on SCr of 0.58 mg/dL (L)). Liver Function Tests: Recent Labs  Lab 08/23/2018 0902 08/13/18 0306 08/14/18 0340  AST 2,005* 1,791* 1,466*  ALT 846* 731* 642*  ALKPHOS 139* 131* 116  BILITOT 24.8* 23.7* 25.4*  PROT 8.4* 8.2* 8.2*  ALBUMIN 1.9* 1.8* 2.0*   No results for input(s): LIPASE, AMYLASE in the last 168 hours. Recent Labs  Lab 08/11/2018 1040  AMMONIA 51*   Coagulation Profile: Recent Labs  Lab 08/28/2018 1040 08/13/18 0306 08/14/18 0340  INR 3.2* 3.4* 3.4*   Cardiac Enzymes: No results for input(s): CKTOTAL, CKMB, CKMBINDEX, TROPONINI in the last 168 hours. BNP (last 3 results) No results for input(s): PROBNP in the last 8760 hours. HbA1C: No results for input(s): HGBA1C in the last 72 hours. CBG: No results for input(s): GLUCAP in the last 168 hours. Lipid Profile: No results for input(s): CHOL, HDL, LDLCALC, TRIG, CHOLHDL, LDLDIRECT in the last 72 hours. Thyroid Function Tests: No results for input(s): TSH, T4TOTAL, FREET4, T3FREE, THYROIDAB in the last 72 hours. Anemia Panel: Recent Labs    08/22/2018 1149  VITAMINB12 4,716*  FOLATE 14.0  FERRITIN 26  TIBC 356  IRON 25*  RETICCTPCT 4.5*   Urine analysis: No results found for: COLORURINE, APPEARANCEUR, LABSPEC, PHURINE, GLUCOSEU, HGBUR, BILIRUBINUR,  KETONESUR, PROTEINUR, UROBILINOGEN, NITRITE, LEUKOCYTESUR Sepsis Labs: @LABRCNTIP (procalcitonin:4,lacticacidven:4)  ) Recent Results (from the past 240 hour(s))  SARS Coronavirus 2 (CEPHEID - Performed in [facility] lab), Hosp Order     Status: None   Collection Time: 08/14/2018  7:56 AM  Result Value Ref Range Status   SARS Coronavirus 2 NEGATIVE NEGATIVE Final    Comment: (NOTE) If result is NEGATIVE SARS-CoV-2 target nucleic acids are NOT DETECTED. The SARS-CoV-2 RNA is generally detectable in upper and lower  respiratory specimens during the acute phase of infection. The lowest  concentration of SARS-CoV-2 viral copies this assay can detect is 250  copies / mL. A negative result does not preclude SARS-CoV-2 infection  and should not be used as the sole basis for treatment or other  patient management decisions.  A negative result may occur with  improper specimen collection / handling, submission of specimen other  than nasopharyngeal swab, presence of viral mutation(s) within the  areas targeted by this assay, and inadequate number of  viral copies  (<250 copies / mL). A negative result must be combined with clinical  observations, patient history, and epidemiological information. If result is POSITIVE SARS-CoV-2 target nucleic acids are DETECTED. The SARS-CoV-2 RNA is generally detectable in upper and lower  respiratory specimens dur ing the acute phase of infection.  Positive  results are indicative of active infection with SARS-CoV-2.  Clinical  correlation with patient history and other diagnostic information is  necessary to determine patient infection status.  Positive results do  not rule out bacterial infection or co-infection with other viruses. If result is PRESUMPTIVE POSTIVE SARS-CoV-2 nucleic acids MAY BE PRESENT.   A presumptive positive result was obtained on the submitted specimen  and confirmed on repeat testing.  While 2019 novel coronavirus    (SARS-CoV-2) nucleic acids may be present in the submitted sample  additional confirmatory testing may be necessary for epidemiological  and / or clinical management purposes  to differentiate between  SARS-CoV-2 and other Sarbecovirus currently known to infect humans.  If clinically indicated additional testing with an alternate test  methodology [phone]) is advised. The SARS-CoV-2 RNA is generally  detectable in upper and lower respiratory sp ecimens during the acute  phase of infection. The expected result is Negative. Fact Sheet for Patients:  BoilerBrush.com.cy Fact Sheet for Healthcare Providers: https://pope.com/ This test is not yet approved or cleared by the [place] FDA and has been authorized for detection and/or diagnosis of SARS-CoV-2 by FDA under an Emergency Use Authorization (EUA).  This EUA will remain in effect (meaning this test can be used) for the duration of the COVID-19 declaration under Section 564(b)(1) of the Act, 21 U.S.C. section 360bbb-3(b)(1), unless the authorization is terminated or revoked sooner. Performed at [facility], [address], [place], [place] [address]   Gram stain     Status: None   Collection Time: 08/13/18 11:29 AM  Result Value Ref Range Status   Specimen Description PERITONEAL CAVITY  Final   Special Requests NONE  Final   Gram Stain   Final    CYTOSPIN SMEAR WBC PRESENT, PREDOMINANTLY MONONUCLEAR NO ORGANISMS SEEN Performed at [facility], [address] [place], [place] [address]    Report Status 08/13/2018 FINAL  Final  Culture, body fluid-bottle     Status: None (Preliminary result)   Collection Time: 08/13/18 11:30 AM  Result Value Ref Range Status   Specimen Description PERITONEAL  Final   Special Requests FLUID  Final   Culture   Final    NO GROWTH < 24 HOURS Performed at [facility], [address], [place], [place] [address]    Report  Status PENDING  Incomplete         Radiology Studies: US Liver Doppler  Result Date: 08/13/2018 CLINICAL DATA:  Cirrhosis, ascites and hepatic failure. EXAM: DUPLEX ULTRASOUND OF LIVER TECHNIQUE: Color and duplex Doppler ultrasound was performed to evaluate the hepatic in-flow and out-flow vessels. COMPARISON:  Right upper quadrant abdominal ultrasound on 08/11/2018 at [facility] FINDINGS: Portal Vein Velocities Main:  30 cm/sec Right:  19 cm/sec Left:  24 cm/sec There is no evidence of portal vein thrombus. Direction of portal vein flow is in a normal direction towards the liver. Waveforms are unremarkable and there is no significant reduction in portal vein velocities. Hepatic Vein Velocities Right:  51 cm/sec Middle:  43 cm/sec Left:  58 cm/sec No evidence of hepatic veno-occlusive disease. Hepatic vein waveforms and velocities are normal. Hepatic  Artery Velocity:  186 cm/sec Splenic Vein Velocity: 73 cm/sec. No evidence of splenic vein thrombosis. Varices: No visualized varices. Ascites: Ascites present. The spleen is moderately enlarged with estimated volume of 602 mL. The liver again demonstrates evidence of cirrhosis without focal mass or evidence of intrahepatic biliary ductal dilatation. IMPRESSION: 1. Patent portal vein with normal direction of flow and no significant reduction in velocities. 2. Normally patent hepatic veins. 3. Moderate splenomegaly with estimated volume of 602 mL. 4. Evidence of cirrhosis without evidence of hepatic mass or intrahepatic biliary ductal dilatation by ultrasound. 5. Ascites. Electronically Signed   By: Irish Lack M.D.   On: 08/13/2018 13:10   Ir Paracentesis  Result Date: 08/13/2018 INDICATION: Ascites secondary to alcoholic cirrhosis and hepatitis C infection. Request for diagnostic and therapeutic paracentesis. EXAM: ULTRASOUND GUIDED PARACENTESIS MEDICATIONS: 1% lidocaine 10 mL COMPLICATIONS: None immediate. PROCEDURE: Informed written consent was  obtained from the patient after a discussion of the risks, benefits and alternatives to treatment. A timeout was performed prior to the initiation of the procedure. Initial ultrasound scanning demonstrates a large amount of ascites within the left lower abdominal quadrant. The left lower abdomen was prepped and draped in the usual sterile fashion. 1% lidocaine with epinephrine was used for local anesthesia. Following this, a 19 gauge, 7-cm, Yueh catheter was introduced. An ultrasound image was saved for documentation purposes. The paracentesis was performed. The catheter was removed and a dressing was applied. The patient tolerated the procedure well without immediate post procedural complication. FINDINGS: A total of approximately 3.75 L of clear yellow fluid was removed. Samples were sent to the laboratory as requested by the clinical team. IMPRESSION: Successful ultrasound-guided paracentesis yielding 3.75 liters of peritoneal fluid. Read by: Corrin Parker, PA-C Electronically Signed   By: Judie Petit.  Shick M.D.   On: 08/13/2018 12:00        Scheduled Meds:  docusate sodium  100 mg Oral BID   furosemide  40 mg Oral Daily   lactulose  10 g Oral TID   nicotine  14 mg Transdermal Daily   spironolactone  25 mg Oral Daily   Continuous Infusions:  sodium chloride Stopped (08/13/18 2135)     LOS: 2 days    Time spent: 35 minutes    Alberteen Sam, MD [facility] 08/14/2018, 2:30 PM     Please page through AMION:  [email or web address] Password [email or web address] If 7PM-7AM, please contact night-coverage

## 2018-08-14 NOTE — TOC Initial Note (Signed)
Transition of Care [facility]) - Initial/Assessment Note    Patient Details  Name: Mark Weeks MRN: [id number] Date of Birth: 15-Oct-1970  [facility] CM/SW Contact:    Mark Haven, RN Phone Number: 08/14/2018, 3:55 PM  Clinical Narrative:                 From home with sister Mark Weeks , acute hepatic failure , IVDU, ETOH abuse, , covid negative, s/p paracentesis on 5/6, he will need hospital follow up apt scheduled and he states he will need ast with meds at dc he has enough funds with him to pay 3.00 with [employer] with [facility] pharmacy at dc.  NCM Contacted TOC pharmacy to notify will need them to fill scripts at dc. Patient states he has transport at dc.     Expected Discharge Plan: Home/Self Care Barriers to Discharge: No Barriers Identified   Patient Goals and CMS Choice Patient states their goals for this hospitalization and ongoing recovery are:: take it easy   Choice offered to / list presented to : NA  Expected Discharge Plan and Services Expected Discharge Plan: Home/Self Care In-house Referral: NA Discharge Planning Services: CM Consult Post Acute Care Choice: NA Living arrangements for the past 2 months: Single Family Home                 DME Arranged: N/A DME Agency: NA                  Prior Living Arrangements/Services Living arrangements for the past 2 months: Single Family Home Lives with:: Siblings Patient language and need for interpreter reviewed:: Yes Do you feel safe going back to the place where you live?: Yes      Need for Family Participation in Patient Care: Yes (Comment) Care giver support system in place?: Yes (comment)   Criminal Activity/Legal Involvement Pertinent to Current Situation/Hospitalization: No - Comment as needed  Activities of Daily Living Home Assistive Devices/Equipment: None ADL Screening (condition at time of admission) Patient's cognitive ability adequate to safely complete daily activities?: Yes Is the  patient deaf or have difficulty hearing?: No Does the patient have difficulty seeing, even when wearing glasses/contacts?: No Does the patient have difficulty concentrating, remembering, or making decisions?: No Patient able to express need for assistance with ADLs?: Yes Does the patient have difficulty dressing or bathing?: No Independently performs ADLs?: Yes (appropriate for developmental age) Does the patient have difficulty walking or climbing stairs?: No Weakness of Legs: None Weakness of Arms/Hands: None  Permission Sought/Granted   Permission granted to share information with : Yes, Verbal Permission Granted  Share Information with NAME: Mark Weeks     Permission granted to share info w Relationship: sister  Permission granted to share info w Contact Information: Mark Weeks [phone]  Emotional Assessment   Attitude/Demeanor/Rapport: Engaged Affect (typically observed): Accepting, Appropriate Orientation: : Oriented to Place, Oriented to Self, Oriented to  Time, Oriented to Situation Alcohol / Substance Use: Alcohol Use, Illicit Drugs Psych Involvement: No (comment)  Admission diagnosis:  ALCOHOL HEPATITIS Patient Active Problem List   Diagnosis Date Noted  . Elevated LFTs 09-02-18  . Acute hepatic failure 09-02-2018  . Tobacco dependence 2018-09-02  . Abdominal pain   . Ascites   . Coagulopathy (HCC)   . Polysubstance abuse (HCC)    PCP:  No primary care provider on file. Pharmacy:   [facility] [place], [place] - [address] [place] [address] Phone: [phone] Fax: [phone]  [facility] [address] [place] [address] Phone: [phone] Fax: [phone]     Social Determinants of Health (SDOH) Interventions    Readmission Risk Interventions Readmission Risk Prevention Plan 08/14/2018  Transportation Screening Complete  Some  recent data might be hidden

## 2018-08-14 NOTE — Consult Note (Signed)
[facility]    Date of Admission:  08-27-2018     Total days of antibiotics 2               Reason for Consult: Cirrhosis / Hep B / Hep C    Referring Provider: [place] Primary Care Provider: No primary care provider on file.   Assessment/Plan:  Mr. Braner is a 48 y/o male with history of current substance abuse with methamphetamine and alcohol dependence in remission x 6 years presenting with acute liver failure and newly diagnosed cirrhosis in the setting of acute Hepatitis B / C or flare of Hepatitis B.  Acute Liver Failure - Currently with positive Hepatitis B surface Ag, e antigen, and core IgM with remaining serologies pending. This is likely either an acute infection or a flare. Continue management per GI.   Decompensated cirrhosis - History of alcohol usage and now Hepatitis B infection and positive Hepatitis C antibody. Hepatic and portal veins with no significant findings and no focal lesions concerning for HCC. EBV and CMV serologies positive, although given positive IgG unlikely acute infection with either EBV or CMV. Paracentesis performed with no evidence of spontaneous bacterial peritonitis and will discontinue ceftriaxone.   Hepatitis B Infection - New diagnosis. Given cirrhosis will plan to start antiretroviral therapy with entecavir or tenofovir. Continue to monitor serologies. LFTs slowly improving.  Positive Hepatitis C Antibody - No previous treatment for Hepatitis C. Await RNA levels to determine need for treatment. If present will consider outpatient treatment.   Healthcare Maintenance - HIV negative.    Principal Problem:   Acute hepatic failure Active Problems:   Elevated LFTs   Ascites   Coagulopathy (HCC)   Polysubstance abuse (HCC)   Tobacco dependence   . docusate sodium  100 mg Oral BID  . ferrous sulfate  325 mg Oral BID WC  . furosemide  40 mg Oral Daily  . lactulose  10 g Oral TID  . nicotine  14 mg Transdermal  Daily  . spironolactone  25 mg Oral Daily     HPI: Agnes Wallen is a 48 y.o. male with previous medical history significant for polysubstance abuse and alcohol dependence in remission x6 years admitted to [facility] with generalized weakness and jaundice starting about 2 weeks prior to presentation.  He reports smoking methamphetamine.  Noted to have markedly elevated liver function tests with AST of 2534, ALT of 1360 and bilirubin of 22.8.  INR at 2.7 with Tylenol level and ammonia levels being normal.  Ultrasound performed at [facility] with cirrhosis and mild perihepatic ascites.  Transferred to [facility] for further evaluation.  Serological work-up positive for hepatitis C antibody, hepatitis B surface antigen, hepatitis B core IgM, Hepatitis B e Ag, CMV IgM along with elevated IgG, IgM, and IgA.  HIV was negative.  Remaining hepatitis B serology is pending. Paracentesis performed on 5/6 with WBC count of 146, 8% neutrophils and 59% monocytes. Peritoneal fluid culture without growth in <24 hours.   Mr. Stadtler has been afebrile since admission and without leukocytosis. He was started on Ceftriaxone. LFTs stable with slight improvement over the last 24 hours. Bilirubin remains elevated. He continues to have abdominal pain primarily in the RUQ. Describes how symptoms started one day. He has been sober for 6 years from alcohol but does continue to smoke methamphetamine.   Review of Systems: Review of Systems  Constitutional: Positive for malaise/fatigue. Negative for chills, fever and weight loss.  Respiratory: Negative for cough, shortness of breath and wheezing.   Cardiovascular: Negative for chest pain and leg swelling.  Gastrointestinal: Positive for abdominal pain. Negative for constipation, diarrhea, nausea and vomiting.     Past Medical History:  Diagnosis Date  . Alcohol dependence (HCC)    in remission x 6 years  . Polysubstance abuse (HCC)    amphetamines, marijuana    Social  History   Tobacco Use  . Smoking status: Current Every Day Smoker    Packs/day: 1.00  . Smokeless tobacco: Never Used  Substance Use Topics  . Alcohol use: Not Currently    Comment: h/o heavy use  . Drug use: Yes    Types: Amphetamines, Marijuana    History reviewed. No pertinent family history.  No Known Allergies  OBJECTIVE: Blood pressure 116/71, pulse 91, temperature 97.6 F (36.4 C), temperature source Oral, resp. rate 18, height  (1.651 m), weight 79.5 kg, SpO2 96 %.  Physical Exam Constitutional:      General: He is not in acute distress.    Appearance: He is well-developed.     Comments: Seated in the chair; pleasant but slow to respond to questions.   Cardiovascular:     Rate and Rhythm: Normal rate and regular rhythm.     Heart sounds: Normal heart sounds.  Pulmonary:     Effort: Pulmonary effort is normal.     Breath sounds: Normal breath sounds.  Abdominal:     General: Bowel sounds are normal. There is distension.     Palpations: There is no mass.     Tenderness: There is abdominal tenderness in the right upper quadrant. There is no rebound. Negative signs include Murphy's sign and McBurney's sign.  Skin:    General: Skin is warm and dry.  Neurological:     Mental Status: He is alert and oriented to person, place, and time.  Psychiatric:        Behavior: Behavior normal.        Thought Content: Thought content normal.        Judgment: Judgment normal.     Lab Results Lab Results  Component Value Date   WBC 7.1 08/14/2018   HGB 8.4 (L) 08/14/2018   HCT 25.8 (L) 08/14/2018   MCV 72.9 (L) 08/14/2018   PLT 297 08/14/2018    Lab Results  Component Value Date   CREATININE 0.58 (L) 08/14/2018   BUN 16 08/14/2018   NA 126 (L) 08/14/2018   K 4.1 08/14/2018   CL 99 08/14/2018   CO2 21 (L) 08/14/2018    Lab Results  Component Value Date   ALT 642 (H) 08/14/2018   AST 1,466 (H) 08/14/2018   ALKPHOS 116 08/14/2018   BILITOT 25.4 (HH)  08/14/2018     Microbiology: Recent Results (from the past 240 hour(s))  SARS Coronavirus 2 (CEPHEID - Performed in [facility] hospital lab), Hosp Order     Status: None   Collection Time: 09-03-18  7:56 AM  Result Value Ref Range Status   SARS Coronavirus 2 NEGATIVE NEGATIVE Final    Comment: (NOTE) If result is NEGATIVE SARS-CoV-2 target nucleic acids are NOT DETECTED. The SARS-CoV-2 RNA is generally detectable in upper and lower  respiratory specimens during the acute phase of infection. The lowest  concentration of SARS-CoV-2 viral copies this assay can detect is 250  copies / mL. A negative result does not preclude SARS-CoV-2 infection  and should not be used as the sole basis for  treatment or other  patient management decisions.  A negative result may occur with  improper specimen collection / handling, submission of specimen other  than nasopharyngeal swab, presence of viral mutation(s) within the  areas targeted by this assay, and inadequate number of viral copies  (<250 copies / mL). A negative result must be combined with clinical  observations, patient history, and epidemiological information. If result is POSITIVE SARS-CoV-2 target nucleic acids are DETECTED. The SARS-CoV-2 RNA is generally detectable in upper and lower  respiratory specimens dur ing the acute phase of infection.  Positive  results are indicative of active infection with SARS-CoV-2.  Clinical  correlation with patient history and other diagnostic information is  necessary to determine patient infection status.  Positive results do  not rule out bacterial infection or co-infection with other viruses. If result is PRESUMPTIVE POSTIVE SARS-CoV-2 nucleic acids MAY BE PRESENT.   A presumptive positive result was obtained on the submitted specimen  and confirmed on repeat testing.  While 2019 novel coronavirus  (SARS-CoV-2) nucleic acids may be present in the submitted sample  additional confirmatory  testing may be necessary for epidemiological  and / or clinical management purposes  to differentiate between  SARS-CoV-2 and other Sarbecovirus currently known to infect humans.  If clinically indicated additional testing with an alternate test  methodology [phone] is advised. The SARS-CoV-2 RNA is generally  detectable in upper and lower respiratory sp ecimens during the acute  phase of infection. The expected result is Negative. Fact Sheet for Patients:  BoilerBrush.com.cyhttps://www.fda.gov/media/136312/download Fact Sheet for Healthcare Providers: https://pope.com/https://www.fda.gov/media/136313/download This test is not yet approved or cleared by the [place] FDA and has been authorized for detection and/or diagnosis of SARS-CoV-2 by FDA under an Emergency Use Authorization (EUA).  This EUA will remain in effect (meaning this test can be used) for the duration of the COVID-19 declaration under Section 564(b)(1) of the Act, 21 U.S.C. section 360bbb-3(b)(1), unless the authorization is terminated or revoked sooner. Performed at [facility], [address] [place], [place] [address]   Gram stain     Status: None   Collection Time: 08/13/18 11:29 AM  Result Value Ref Range Status   Specimen Description PERITONEAL CAVITY  Final   Special Requests NONE  Final   Gram Stain   Final    CYTOSPIN SMEAR WBC PRESENT, PREDOMINANTLY MONONUCLEAR NO ORGANISMS SEEN Performed at [facility], [address] [place], [place] [address]    Report Status 08/13/2018 FINAL  Final  Culture, body fluid-bottle     Status: None (Preliminary result)   Collection Time: 08/13/18 11:30 AM  Result Value Ref Range Status   Specimen Description PERITONEAL  Final   Special Requests FLUID  Final   Culture   Final    NO GROWTH < 24 HOURS Performed at [facility], [address] [place], [place] [address]    Report Status PENDING  Incomplete     Marcos EkeGreg Calone, NP [facility] [phone] Pager  08/14/2018  2:56 PM

## 2018-08-14 NOTE — Progress Notes (Addendum)
Progress Note    ASSESSMENT AND PLAN:    1. Newly diagnosed decompensated cirrhosis  (hx of Etoh, also HCV ab positive, viral load pending to confirm) with  superimposed acute HBV with positive HB E ag . He continues to complain of generalized abdominal pain  -CMV IgM and EBV VCA IgM both elevated. Immunoglobulins all elevated. Are there a cross reaction related to acute HBV? HIV negative. Recommend ID consult.  -Check other markers for chronic liver disease -AMA, ASMA, AMA pending -awaiting HCV RNA PCR and HBV DNA quant -No response to another dose of Vit K yesterday. INR still 3.4 today.  -S/p 3.75 liter LVP yesterday, no organisms on gram stain, no evidence for SBP on cell count. Tprotein < 3. Cytology pending.  -Hepatic and portal veins unremarkable on liver doppler study.  -eventual EGD varices screening -HCC screening: no focal lesions on U/S. AFP with am labs -Etiology of abdominal pain not yet clear. He endorses possible constipation (though reported loose stool on admission). Meant to start Lactulose yesterday but didn't. Will start to treat constipation and encephalopathy -am liver tests, INR   2. Microcytic Anemia, no overt GI bleeding. Hgb 9-10 range. -Very possible that he is iron deficient with ferritin of 26, TIBC 356, 7% sat.  -Hgb continues to decline 9.7 >> 9.1 >>> 8.4. No active bleeding.   -B12 , folate normal.  -eventual EGD / colonoscopy    SUBJECTIVE   Complaints of generalized abdominal pain, believes it gets worse with eating and he has barely touched breakfast.    OBJECTIVE:     Vital signs in last 24 hours: Temp:  [97.6 F (36.4 C)-98.2 F (36.8 C)] 97.6 F (36.4 C) (05/07 0526) Pulse Rate:  [87-91] 91 (05/07 0526) Resp:  [16-18] 18 (05/07 0526) BP: (113-116)/(66-71) 116/71 (05/07 0526) SpO2:  [94 %-96 %] 96 % (05/07 0526) Last BM Date: (pt is unsure) General:   Alert,  male in NAD in bedside chair.   Heart:  Regular rate and  rhythm; no murmur, 1-2 BLE edema  Pulm: Normal respiratory effort Abdomen:  Soft, distended, not particularly tender. A few bowel sounds,.       Psych:   cooperative.    Intake/Output from previous day: 05/06 0701 - 05/07 0700 In: -  Out: 900 [Urine:900] Intake/Output this shift: No intake/output data recorded.  Lab Results: Recent Labs    08/24/2018 0902 08/13/18 0306 08/14/18 0340  WBC 6.7 6.9 7.1  HGB 9.7* 9.1* 8.4*  HCT 29.7* 27.7* 25.8*  PLT 329 301 297   BMET Recent Labs    08/18/2018 0902 08/13/18 0306 08/14/18 0340  NA 128* 125* 126*  K 3.7 4.2 4.1  CL 102 98 99  CO2 20* 19* 21*  GLUCOSE 78 101* 88  BUN 10 14 16   CREATININE 0.61 0.66 0.58*  CALCIUM 7.9* 7.9* 8.0*   LFT Recent Labs    08/14/18 0340  PROT 8.2*  ALBUMIN 2.0*  AST 1,466*  ALT 642*  ALKPHOS 116  BILITOT 25.4*  BILIDIR 15.8*  IBILI 9.6*   PT/INR Recent Labs    08/13/18 0306 08/14/18 0340  LABPROT 33.6* 33.9*  INR 3.4* 3.4*   Hepatitis Panel Recent Labs    08/08/2018 0902  HEPBSAG Positive*  HCVAB >11.0*  HEPAIGM Negative  HEPBIGM Positive*    US Liver Doppler  Result Date: 08/13/2018 CLINICAL DATA:  Cirrhosis, ascites and hepatic failure. EXAM: DUPLEX ULTRASOUND OF LIVER TECHNIQUE: Color and duplex Doppler  ultrasound was performed to evaluate the hepatic in-flow and out-flow vessels. COMPARISON:  Right upper quadrant abdominal ultrasound [facility] 08/11/2018 [facility] FINDINGS: Portal Vein Velocities Main:  30 cm/sec Right:  19 cm/sec Left:  24 cm/sec There is no evidence of portal vein thrombus. Direction of portal vein flow is in a normal direction towards the liver. Waveforms are unremarkable and there is no significant reduction in portal vein velocities. Hepatic Vein Velocities Right:  51 cm/sec Middle:  43 cm/sec Left:  58 cm/sec No evidence of hepatic veno-occlusive disease. Hepatic vein waveforms and velocities are normal. Hepatic Artery Velocity:  186 cm/sec Splenic Vein  Velocity: 73 cm/sec. No evidence of splenic vein thrombosis. Varices: No visualized varices. Ascites: Ascites present. The spleen is moderately enlarged with estimated volume of 602 mL. The liver again demonstrates evidence of cirrhosis without focal mass or evidence of intrahepatic biliary ductal dilatation. IMPRESSION: 1. Patent portal vein with normal direction of flow and no significant reduction in velocities. 2. Normally patent hepatic veins. 3. Moderate splenomegaly with estimated volume of 602 mL. 4. Evidence of cirrhosis without evidence of hepatic mass or intrahepatic biliary ductal dilatation by ultrasound. 5. Ascites. Electronically Signed   By: Irish LackGlenn  Yamagata M.D.   On: 08/13/2018 13:10   Ir Paracentesis  Result Date: 08/13/2018 INDICATION: Ascites secondary to alcoholic cirrhosis and hepatitis C infection. Request for diagnostic and therapeutic paracentesis. EXAM: ULTRASOUND GUIDED PARACENTESIS MEDICATIONS: 1% lidocaine 10 mL COMPLICATIONS: None immediate. PROCEDURE: Informed written consent was obtained from the patient after a discussion of the risks, benefits and alternatives to treatment. A timeout was performed prior to the initiation of the procedure. Initial ultrasound scanning demonstrates a large amount of ascites within the left lower abdominal quadrant. The left lower abdomen was prepped and draped in the usual sterile fashion. 1% lidocaine with epinephrine was used for local anesthesia. Following this, a 19 gauge, 7-cm, Yueh catheter was introduced. An ultrasound image was saved for documentation purposes. The paracentesis was performed. The catheter was removed and a dressing was applied. The patient tolerated the procedure well without immediate post procedural complication. FINDINGS: A total of approximately 3.75 L of clear yellow fluid was removed. Samples were sent to the laboratory as requested by the clinical team. IMPRESSION: Successful ultrasound-guided paracentesis yielding  3.75 liters of peritoneal fluid. Read by: Corrin ParkerWendy Blair, PA-C Electronically Signed   By: Judie PetitM.  Shick M.D.   On: 08/13/2018 12:00      Principal Problem:   Acute hepatic failure Active Problems:   Elevated LFTs   Ascites   Coagulopathy (HCC)   Polysubstance abuse (HCC)   Tobacco dependence     LOS: 2 days   Willette ClusterPaula Elisea Khader ,NP 08/14/2018, 9:11 AM

## 2018-08-14 NOTE — Progress Notes (Signed)
Pt's pain is not being managed with 50mg  tramadol Q12H.  I paged night coverage overnight who ordered a 1 time dose of 10mg  oxycodone which seemed to help temporarily.    Also, his lactulose is PRN right now and should perhaps be scheduled.

## 2018-08-15 ENCOUNTER — Inpatient Hospital Stay (HOSPITAL_COMMUNITY): Payer: Medicaid Other

## 2018-08-15 DIAGNOSIS — G9340 Encephalopathy, unspecified: Secondary | ICD-10-CM

## 2018-08-15 DIAGNOSIS — K7290 Hepatic failure, unspecified without coma: Secondary | ICD-10-CM

## 2018-08-15 LAB — HEPATIC FUNCTION PANEL
ALT: 605 U/L — ABNORMAL HIGH (ref 0–44)
AST: 1373 U/L — ABNORMAL HIGH (ref 15–41)
Albumin: 2.2 g/dL — ABNORMAL LOW (ref 3.5–5.0)
Alkaline Phosphatase: 121 U/L (ref 38–126)
Bilirubin, Direct: 17.3 mg/dL — ABNORMAL HIGH (ref 0.0–0.2)
Indirect Bilirubin: 12 mg/dL — ABNORMAL HIGH (ref 0.3–0.9)
Total Bilirubin: 29.3 mg/dL (ref 0.3–1.2)
Total Protein: 8.8 g/dL — ABNORMAL HIGH (ref 6.5–8.1)

## 2018-08-15 LAB — KAPPA/LAMBDA LIGHT CHAINS
Kappa free light chain: 91.7 mg/L — ABNORMAL HIGH (ref 3.3–19.4)
Kappa, lambda light chain ratio: 1.33 (ref 0.26–1.65)
Lambda free light chains: 69.1 mg/L — ABNORMAL HIGH (ref 5.7–26.3)

## 2018-08-15 LAB — CBC
HCT: 29.9 % — ABNORMAL LOW (ref 39.0–52.0)
Hemoglobin: 9.9 g/dL — ABNORMAL LOW (ref 13.0–17.0)
MCH: 24.5 pg — ABNORMAL LOW (ref 26.0–34.0)
MCHC: 33.1 g/dL (ref 30.0–36.0)
MCV: 74 fL — ABNORMAL LOW (ref 80.0–100.0)
Platelets: 317 10*3/uL (ref 150–400)
RBC: 4.04 MIL/uL — ABNORMAL LOW (ref 4.22–5.81)
RDW: 30.5 % — ABNORMAL HIGH (ref 11.5–15.5)
WBC: 8.7 10*3/uL (ref 4.0–10.5)
nRBC: 0.2 % (ref 0.0–0.2)

## 2018-08-15 LAB — MITOCHONDRIAL ANTIBODIES: Mitochondrial M2 Ab, IgG: <20 Units (ref 0.0–20.0)

## 2018-08-15 LAB — PROTEIN ELECTROPHORESIS, SERUM
A/G Ratio: 0.5 — ABNORMAL LOW (ref 0.7–1.7)
Albumin ELP: 2.5 g/dL — ABNORMAL LOW (ref 2.9–4.4)
Alpha-1-Globulin: 0.1 g/dL (ref 0.0–0.4)
Alpha-2-Globulin: 0.3 g/dL — ABNORMAL LOW (ref 0.4–1.0)
Beta Globulin: 0.7 g/dL (ref 0.7–1.3)
Gamma Globulin: 3.7 g/dL — ABNORMAL HIGH (ref 0.4–1.8)
Globulin, Total: 4.8 g/dL — ABNORMAL HIGH (ref 2.2–3.9)
Total Protein ELP: 7.3 g/dL (ref 6.0–8.5)

## 2018-08-15 LAB — BASIC METABOLIC PANEL
Anion gap: 8 (ref 5–15)
BUN: 18 mg/dL (ref 6–20)
CO2: 20 mmol/L — ABNORMAL LOW (ref 22–32)
Calcium: 8.2 mg/dL — ABNORMAL LOW (ref 8.9–10.3)
Chloride: 95 mmol/L — ABNORMAL LOW (ref 98–111)
Creatinine, Ser: 0.76 mg/dL (ref 0.61–1.24)
GFR calc Af Amer: >60 mL/min (ref >60)
GFR calc non Af Amer: >60 mL/min (ref >60)
Glucose, Bld: 99 mg/dL (ref 70–99)
Potassium: 4.3 mmol/L (ref 3.5–5.1)
Sodium: 123 mmol/L — ABNORMAL LOW (ref 135–145)

## 2018-08-15 LAB — PROTIME-INR
INR: 3.9 — ABNORMAL HIGH (ref 0.8–1.2)
Prothrombin Time: 37.3 seconds — ABNORMAL HIGH (ref 11.4–15.2)

## 2018-08-15 MED ORDER — DEXTROSE 5 % IV SOLN
12.5000 mg/kg/h | INTRAVENOUS | Status: AC
  Administered 2018-08-15: 12.5 mg/kg/h via INTRAVENOUS
  Filled 2018-08-15 (×5): qty 50

## 2018-08-15 MED ORDER — PROMETHAZINE HCL 25 MG/ML IJ SOLN
12.5000 mg | Freq: Once | INTRAMUSCULAR | Status: AC
  Administered 2018-08-16: 12.5 mg via INTRAMUSCULAR
  Filled 2018-08-15: qty 1

## 2018-08-15 MED ORDER — PROMETHAZINE HCL 25 MG/ML IJ SOLN
12.5000 mg | Freq: Four times a day (QID) | INTRAMUSCULAR | Status: DC | PRN
  Administered 2018-08-15 (×2): 12.5 mg via INTRAVENOUS
  Filled 2018-08-15 (×2): qty 1

## 2018-08-15 MED ORDER — DEXTROSE 5 % IV SOLN
6.2500 mg/kg/h | INTRAVENOUS | Status: DC
  Administered 2018-08-15: 6.25 mg/kg/h via INTRAVENOUS
  Filled 2018-08-15 (×2): qty 200

## 2018-08-15 MED ORDER — LACTULOSE 10 GM/15ML PO SOLN
20.0000 g | Freq: Three times a day (TID) | ORAL | Status: DC
  Administered 2018-08-15 – 2018-08-16 (×2): 20 g via ORAL
  Filled 2018-08-15 (×3): qty 30

## 2018-08-15 MED ORDER — PROMETHAZINE HCL 25 MG PO TABS
25.0000 mg | ORAL_TABLET | Freq: Four times a day (QID) | ORAL | Status: DC | PRN
  Administered 2018-08-16: 12:00:00 25 mg via ORAL
  Filled 2018-08-15: qty 1

## 2018-08-15 MED ORDER — PROMETHAZINE HCL 25 MG RE SUPP
25.0000 mg | Freq: Four times a day (QID) | RECTAL | Status: DC | PRN
  Filled 2018-08-15: qty 1

## 2018-08-15 MED ORDER — ACETYLCYSTEINE LOAD VIA INFUSION
150.0000 mg/kg | Freq: Once | INTRAVENOUS | Status: AC
  Administered 2018-08-15: 11925 mg via INTRAVENOUS
  Filled 2018-08-15: qty 299

## 2018-08-15 MED ORDER — TENOFOVIR ALAFENAMIDE FUMARATE 25 MG PO TABS
25.0000 mg | ORAL_TABLET | Freq: Every morning | ORAL | Status: DC
  Administered 2018-08-15 – 2018-08-16 (×2): 25 mg via ORAL
  Filled 2018-08-15 (×3): qty 1

## 2018-08-15 NOTE — Progress Notes (Signed)
Patient continues to vomit coffee ground emesis.  Current IV med ineffective. Notified on call C. Bodenheimer, NP.  Will continue to monitor the patient.

## 2018-08-15 NOTE — Progress Notes (Signed)
Patient is 48 yo male with h/o ETOH abuse and IVDU who presented with progressive jaundice and abdominal swelling.   Pharmacy consulted by GI for acetylcysteine dosing for acute liver failure. Patient with hepatitis B, Hepatitis C and decompensated cirrhosis (ascites, hepatic encephalopathy, and coagulopathy).  AST 1373, ALT 605, Tbili 29.3, INR 3.9  Plan: Per references, will proceed with dose of 150mg /kg Iv load over 1 hour, then 12.5 mg/kg/hr for 4 hours and then 6.25 mg/kg/hr x 67 hours for a total duration of 72 hours.   Thank you,   Avanthika Dehnert A. Jeanella Craze, PharmD, BCPS Clinical Pharmacist [place] Pager: [phone] Please utilize Amion for appropriate phone number to reach the unit pharmacist [facility])

## 2018-08-15 NOTE — Progress Notes (Signed)
[facility]  Date of Admission:  08-25-2018     Total days of antibiotics 2         ASSESSMENT/PLAN  Mr. 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  Acute liver failure - Worsening encephalopathy and increased INR today. Gastroenterology increasing lactulose today and call placed to transplant center. Cause of liver failure at present remains unclear between previous hx of EtOH use and Hepatitis.   Decompensated cirrhosis - Medications adjusted today. Continue management per gastroenterology.   Hepatitis B infection - Continuing to await further serological results and DNA levels. Question acute infection versus flare of chronic Hepatitis B. Will start tenofovir alafenomide 25 mg daily today. Continue to monitor serologies. Patient assistance paperwork completed with pharmacy staff if/when ready to discharge.   Positive Hepatitis C Antibody - Awaiting further serologies to determine current status of infection.    Principal Problem:   Acute hepatic failure Active Problems:   Elevated LFTs   Ascites   Coagulopathy (HCC)   Polysubstance abuse (HCC)   Tobacco dependence   . acetylcysteine  150 mg/kg Intravenous Once  . docusate sodium  100 mg Oral BID  . ferrous sulfate  325 mg Oral BID WC  . furosemide  40 mg Oral Daily  . lactulose  20 g Oral TID  . nicotine  14 mg Transdermal Daily  . spironolactone  25 mg Oral Daily  . Tenofovir Alafenamide Fumarate  25 mg Oral q morning - 10a    SUBJECTIVE:  Afebrile overnight with no acute events or complaints. He is ok and continues to have abdominal pain.    No Known Allergies   Review of Systems: Review of Systems  Constitutional: Negative for chills, fever and weight loss.   Respiratory: Negative for cough, shortness of breath and wheezing.   Cardiovascular: Negative for chest pain and leg swelling.  Gastrointestinal: Positive for abdominal pain. Negative for constipation, diarrhea, nausea and vomiting.  Skin: Negative for rash.      OBJECTIVE: Vitals:   08/14/18 0526 08/14/18 1456 08/14/18 2121 08/15/18 0554  BP: 116/71 119/77 112/68 117/73  Pulse: 91 81 84 84  Resp: 18 17 19 18   Temp: 97.6 F (36.4 C) (!) 97.5 F (36.4 C) 98.9 F (37.2 C) 97.7 F (36.5 C)  TempSrc: Oral Oral  Oral  SpO2: 96% 98% 97% 97%  Weight:      Height:       Body mass index is 29.17 kg/m.  Physical Exam Constitutional:      General: He is not in acute distress.    Appearance: He is well-developed. He is ill-appearing.     Comments: Lying in bed with head of bed elevated; lethargic and jaundice  Cardiovascular:     Rate and Rhythm: Normal rate and regular rhythm.     Heart sounds: Normal heart sounds.  Pulmonary:     Effort: Pulmonary effort is normal.     Breath sounds: Normal breath sounds.  Abdominal:     General: There is distension.     Palpations: There is no mass.     Tenderness: There is abdominal tenderness. There is no guarding.  Skin:    General: Skin is warm and dry.     Coloration: Skin is  jaundiced.  Neurological:     Mental Status: He is alert and oriented to person, place, and time.  Psychiatric:        Behavior: Behavior normal.        Thought Content: Thought content normal.        Judgment: Judgment normal.     Lab Results Lab Results  Component Value Date   WBC 8.7 08/15/2018   HGB 9.9 (L) 08/15/2018   HCT 29.9 (L) 08/15/2018   MCV 74.0 (L) 08/15/2018   PLT 317 08/15/2018    Lab Results  Component Value Date   CREATININE 0.76 08/15/2018   BUN 18 08/15/2018   NA 123 (L) 08/15/2018   K 4.3 08/15/2018   CL 95 (L) 08/15/2018   CO2 20 (L) 08/15/2018    Lab Results  Component Value Date   ALT 605 (H) 08/15/2018   AST 1,373  (H) 08/15/2018   ALKPHOS 121 08/15/2018   BILITOT 29.3 (HH) 08/15/2018     Microbiology: Recent Results (from the past 240 hour(s))  SARS Coronavirus 2 (CEPHEID - Performed in [facility] hospital lab), Hosp Order     Status: None   Collection Time: 08/15/2018  7:56 AM  Result Value Ref Range Status   SARS Coronavirus 2 NEGATIVE NEGATIVE Final    Comment: (NOTE) If result is NEGATIVE SARS-CoV-2 target nucleic acids are NOT DETECTED. The SARS-CoV-2 RNA is generally detectable in upper and lower  respiratory specimens during the acute phase of infection. The lowest  concentration of SARS-CoV-2 viral copies this assay can detect is 250  copies / mL. A negative result does not preclude SARS-CoV-2 infection  and should not be used as the sole basis for treatment or other  patient management decisions.  A negative result may occur with  improper specimen collection / handling, submission of specimen other  than nasopharyngeal swab, presence of viral mutation(s) within the  areas targeted by this assay, and inadequate number of viral copies  (<250 copies / mL). A negative result must be combined with clinical  observations, patient history, and epidemiological information. If result is POSITIVE SARS-CoV-2 target nucleic acids are DETECTED. The SARS-CoV-2 RNA is generally detectable in upper and lower  respiratory specimens dur ing the acute phase of infection.  Positive  results are indicative of active infection with SARS-CoV-2.  Clinical  correlation with patient history and other diagnostic information is  necessary to determine patient infection status.  Positive results do  not rule out bacterial infection or co-infection with other viruses. If result is PRESUMPTIVE POSTIVE SARS-CoV-2 nucleic acids MAY BE PRESENT.   A presumptive positive result was obtained on the submitted specimen  and confirmed on repeat testing.  While 2019 novel coronavirus  (SARS-CoV-2) nucleic acids may be  present in the submitted sample  additional confirmatory testing may be necessary for epidemiological  and / or clinical management purposes  to differentiate between  SARS-CoV-2 and other Sarbecovirus currently known to infect humans.  If clinically indicated additional testing with an alternate test  methodology [phone]) is advised. The SARS-CoV-2 RNA is generally  detectable in upper and lower respiratory sp ecimens during the acute  phase of infection. The expected result is Negative. Fact Sheet for Patients:  BoilerBrush.com.cy Fact Sheet for Healthcare Providers: https://pope.com/ This test is not yet approved or cleared by the [place] FDA and has been authorized for detection and/or diagnosis of SARS-CoV-2 by FDA under an Emergency Use Authorization (EUA).  This EUA will remain  in effect (meaning this test can be used) for the duration of the COVID-19 declaration under Section 564(b)(1) of the Act, 21 U.S.C. section 360bbb-3(b)(1), unless the authorization is terminated or revoked sooner. Performed at [facility], [address] [place], [place] [address]   Gram stain     Status: None   Collection Time: 08/13/18 11:29 AM  Result Value Ref Range Status   Specimen Description PERITONEAL CAVITY  Final   Special Requests NONE  Final   Gram Stain   Final    CYTOSPIN SMEAR WBC PRESENT, PREDOMINANTLY MONONUCLEAR NO ORGANISMS SEEN Performed at [facility], [address] [place], [place] [address]    Report Status 08/13/2018 FINAL  Final  Culture, body fluid-bottle     Status: None (Preliminary result)   Collection Time: 08/13/18 11:30 AM  Result Value Ref Range Status   Specimen Description PERITONEAL  Final   Special Requests FLUID  Final   Culture   Final    NO GROWTH 2 DAYS Performed at [facility], [address] [place], [place] [address]    Report Status PENDING  Incomplete     Marcos EkeGreg  Calone, NP [facility] [phone] Pager  08/15/2018  11:00 AM

## 2018-08-15 NOTE — Progress Notes (Signed)
Patient is actively vomiting continuously.  Unable to administer scheduled meds.  Notified C. Bodenheimer, NP. Will continue to monitor the patient and notify as needed

## 2018-08-15 NOTE — Progress Notes (Signed)
PROGRESS NOTE    Mark Weeks  WUJ:[id number] DOB: [birth year] DOA: 04-Sep-2018 PCP: No primary care provider on file.      Brief Narrative:  Mark Weeks is a 48 y.o. M with hx EtOH abuse and IVDU who presents with 1-2 weeks progressive jaundice and abdominal swelling.      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  Also, Hep C Ab positive.  He has however, an extensive array of immunoglobulins, and GI, ID and I are unclear to what extent all of these represent acute infection, likely none.    However, tenofovir we will start for possible acute Hep B, acyclovir we will defer, doubt acute HSV hepatitis.  HIV negative.  Case was discussed with transplant hepatology already, patient is not a candidate due to ongoing methamphetamine use and lack of insurance/resources.    MELD extremely high, 34.  Paracentesis 5/6 ruled out SBP, showed extremely low protein/albumin content, consistent with portal HTN.  Liver US appears cirrhotic, no masses, normal portal vein.  -Consult GI, appreciate compassionate cares -Consult ID, appreciate expert guidances  -We will start NAC -Start tenofovir  -Continue Lasix and spironolactone -Check I/Os -Consult Palliative Care  -EGD per GI, not urgent, if he survives -Follow SPEP     Coagulopathy INR worsening.  No clinical bleeding.  Hgb stable.  Hepatic encephalopathy Worsening. -Titrate up lactulose  Microcytic anemia Ferritin low.  Suspect chronic GI blood loss.  No clinical bleeding. -Continue iron   Hepatitis C Ab positive Hepatitis B infection -Consult GI and ID  Substance use disorder HIV negative  Hyponatremia No significant change, hypervolemic.   SARS-CoV-2 testing was obtained for screening purposes.  COVID ruled out.         MDM and disposition: Below labs and imaging reports reviewed and  summarized above.  Medication management as above.  The patient was admitted with decompensated hepatic cirrhosis.  This is an acute illness and decompensation of his chronic disease that poses threat to life, with approximately 50% 6 month mortality.    His encephalopathy is worsening.  The case was discussed with GI and infectious disease.  The case has been discussed with transplant hepatology at [facility], unfortunately patient is not a transplant candidate.     DVT prophylaxis: SCDs Code Status: FULL Family Communication: Sister by phone    Consultants:   GI  IR  Procedures:   Paracentesis 5/6  Antimicrobials:   Ceftriaxone 5/5>>5/6    Subjective: Abdominal pain, on the left, still vomiting, still jaundiced, more confused, sleepy.        Objective: Vitals:   08/14/18 1456 08/14/18 2121 08/15/18 0554 08/15/18 1300  BP: 119/77 112/68 117/73 122/74  Pulse: 81 84 84 91  Resp: Temp: (!) 97.5 F (36.4 C) 98.9 F (37.2 C) 97.7 F (36.5 C) 97.7 F (36.5 C)  TempSrc: Oral  Oral Oral  SpO2: 98% 97% 97% 96%  Weight:      Height:        Intake/Output Summary (Last 24 hours) at 08/15/2018 1536 Last data filed at 08/15/2018 1518 Gross per 24 hour  Intake 300 ml  Output 1450 ml  Net -1150 ml   Filed Weights   09-04-18 1234  Weight: 79.5 kg    Examination: General appearance: Tired adult male, sitting in recliner, no acute distress. HEENT: Icterus, no nasal deformity, no nasal discharge or  epistaxis.  No gums bleeding, oropharynx moist, dentition normal, subungual jaundice.  Hearing intact. Skin: Severely jaundiced, no suspicious rashes or lesions. Cardiac: Tachycardic, loud S2, JVP normal, lower extremity edema noted. respiratory: Respiratory effort weak, lungs sounds diminished, no rales or wheezes. Abdomen: Abdomen tense and distended with ascites.  Voluntary guarding throughout, diffuse abdominal pain. MSK: No deformities or effusions. Neuro: Arouses  to voice, groggy, sleepy.  Difficult to follow commands, cannot demonstrate asterixis.  Speech slurred due to sleepiness. Psych: Attention diminished, psychomotor slowing noted, affect blunted, judgment and insight appear mildly impaired.    Data Reviewed: I have personally reviewed following labs and imaging studies:  CBC: Recent Labs  Lab 19-Jul-2018 0902 08/13/18 0306 08/14/18 0340 08/15/18 0349  WBC 6.7 6.9 7.1 8.7  NEUTROABS 4.4  --   --   --   HGB 9.7* 9.1* 8.4* 9.9*  HCT 29.7* 27.7* 25.8* 29.9*  MCV 73.3* 72.3* 72.9* 74.0*  PLT 329 301 297 317   Basic Metabolic Panel: Recent Labs  Lab 19-Jul-2018 0902 08/13/18 0306 08/14/18 0340 08/15/18 0349  NA 128* 125* 126* 123*  K 3.7 4.2 4.1 4.3  CL 102 98 99 95*  CO2 20* 19* 21* 20*  GLUCOSE 78 101* 88 99  BUN 10 14 16 18   CREATININE 0.61 0.66 0.58* 0.76  CALCIUM 7.9* 7.9* 8.0* 8.2*   GFR: Estimated Creatinine Clearance: 108.5 mL/min (by C-G formula based on SCr of 0.76 mg/dL). Liver Function Tests: Recent Labs  Lab 19-Jul-2018 0902 08/13/18 0306 08/14/18 0340 08/15/18 0349  AST 2,005* 1,791* 1,466* 1,373*  ALT 846* 731* 642* 605*  ALKPHOS 139* 131* 116 121  BILITOT 24.8* 23.7* 25.4* 29.3*  PROT 8.4* 8.2* 8.2* 8.8*  ALBUMIN 1.9* 1.8* 2.0* 2.2*   No results for input(s): LIPASE, AMYLASE in the last 168 hours. Recent Labs  Lab 19-Jul-2018 1040  AMMONIA 51*   Coagulation Profile: Recent Labs  Lab 19-Jul-2018 1040 08/13/18 0306 08/14/18 0340 08/15/18 0349  INR 3.2* 3.4* 3.4* 3.9*   Cardiac Enzymes: No results for input(s): CKTOTAL, CKMB, CKMBINDEX, TROPONINI in the last 168 hours. BNP (last 3 results) No results for input(s): PROBNP in the last 8760 hours. HbA1C: No results for input(s): HGBA1C in the last 72 hours. CBG: No results for input(s): GLUCAP in the last 168 hours. Lipid Profile: No results for input(s): CHOL, HDL, LDLCALC, TRIG, CHOLHDL, LDLDIRECT in the last 72 hours. Thyroid Function Tests: No  results for input(s): TSH, T4TOTAL, FREET4, T3FREE, THYROIDAB in the last 72 hours. Anemia Panel: No results for input(s): VITAMINB12, FOLATE, FERRITIN, TIBC, IRON, RETICCTPCT in the last 72 hours. Urine analysis: No results found for: COLORURINE, APPEARANCEUR, LABSPEC, PHURINE, GLUCOSEU, HGBUR, BILIRUBINUR, KETONESUR, PROTEINUR, UROBILINOGEN, NITRITE, LEUKOCYTESUR Sepsis Labs: @LABRCNTIP (procalcitonin:4,lacticacidven:4)  ) Recent Results (from the past 240 hour(s))  SARS Coronavirus 2 (CEPHEID - Performed in [facility] hospital lab), Hosp Order     Status: None   Collection Time: 19-Jul-2018  7:56 AM  Result Value Ref Range Status   SARS Coronavirus 2 NEGATIVE NEGATIVE Final    Comment: (NOTE) If result is NEGATIVE SARS-CoV-2 target nucleic acids are NOT DETECTED. The SARS-CoV-2 RNA is generally detectable in upper and lower  respiratory specimens during the acute phase of infection. The lowest  concentration of SARS-CoV-2 viral copies this assay can detect is 250  copies / mL. A negative result does not preclude SARS-CoV-2 infection  and should not be used as the sole basis for treatment or other  patient management decisions.  A negative result may occur with  improper specimen collection / handling, submission of specimen other  than nasopharyngeal swab, presence of viral mutation(s) within the  areas targeted by this assay, and inadequate number of viral copies  (<250 copies / mL). A negative result must be combined with clinical  observations, patient history, and epidemiological information. If result is POSITIVE SARS-CoV-2 target nucleic acids are DETECTED. The SARS-CoV-2 RNA is generally detectable in upper and lower  respiratory specimens dur ing the acute phase of infection.  Positive  results are indicative of active infection with SARS-CoV-2.  Clinical  correlation with patient history and other diagnostic information is  necessary to determine patient infection status.   Positive results do  not rule out bacterial infection or co-infection with other viruses. If result is PRESUMPTIVE POSTIVE SARS-CoV-2 nucleic acids MAY BE PRESENT.   A presumptive positive result was obtained on the submitted specimen  and confirmed on repeat testing.  While 2019 novel coronavirus  (SARS-CoV-2) nucleic acids may be present in the submitted sample  additional confirmatory testing may be necessary for epidemiological  and / or clinical management purposes  to differentiate between  SARS-CoV-2 and other Sarbecovirus currently known to infect humans.  If clinically indicated additional testing with an alternate test  methodology [phone]) is advised. The SARS-CoV-2 RNA is generally  detectable in upper and lower respiratory sp ecimens during the acute  phase of infection. The expected result is Negative. Fact Sheet for Patients:  BoilerBrush.com.cy Fact Sheet for Healthcare Providers: https://pope.com/ This test is not yet approved or cleared by the [place] FDA and has been authorized for detection and/or diagnosis of SARS-CoV-2 by FDA under an Emergency Use Authorization (EUA).  This EUA will remain in effect (meaning this test can be used) for the duration of the COVID-19 declaration under Section 564(b)(1) of the Act, 21 U.S.C. section 360bbb-3(b)(1), unless the authorization is terminated or revoked sooner. Performed at [facility], [address] [place], [place] [address]   Gram stain     Status: None   Collection Time: 08/13/18 11:29 AM  Result Value Ref Range Status   Specimen Description PERITONEAL CAVITY  Final   Special Requests NONE  Final   Gram Stain   Final    CYTOSPIN SMEAR WBC PRESENT, PREDOMINANTLY MONONUCLEAR NO ORGANISMS SEEN Performed at [facility], [address], [place], [place] [address]    Report Status 08/13/2018 FINAL  Final  Culture, body fluid-bottle     Status:  None (Preliminary result)   Collection Time: 08/13/18 11:30 AM  Result Value Ref Range Status   Specimen Description PERITONEAL  Final   Special Requests FLUID  Final   Culture   Final    NO GROWTH 2 DAYS Performed at [facility], [address] [place], [place] [address]    Report Status PENDING  Incomplete         Radiology Studies: No results found.      Scheduled Meds: . docusate sodium  100 mg Oral BID  . ferrous sulfate  325 mg Oral BID WC  . furosemide  40 mg Oral Daily  . lactulose  20 g Oral TID  . nicotine  14 mg Transdermal Daily  . spironolactone  25 mg Oral Daily  . Tenofovir Alafenamide Fumarate  25 mg Oral q morning - 10a   Continuous Infusions: . sodium chloride Stopped (08/13/18 2135)  . acetylcysteine (ACETADOTE) infusion 10 g/250 ml    .  acetylcysteine (ACETADOTE) infusion 40 g/1000 ML       LOS: 3 days    Time spent: 25 minutes    Alberteen Sam, MD [facility] 08/15/2018, 3:36 PM     Please page through AMION:  [email or web address] Password [email or web address] If 7PM-7AM, please contact night-coverage

## 2018-08-15 NOTE — Progress Notes (Signed)
Progress Note    ASSESSMENT AND PLAN:    1. Newly diagnosed decompensated cirrhosis with superimposed acute HBV. Cirrhosis possibly from hx of Etoh, also ? Chronic HCV (viral load pending). He is becoming more encephalopathic, INR climbing -positive HBE ag.  -Condition deteriorating. Call placed to transplant center.  -Pharmacy consulted for NAC -increasing lactulose dose -To start Tenofovir today.  -started on low dose aldactone / lasix yesterday. Can increase dose tomorrow if renal function stable.  -Several acute autoimmune markers and viral markers are positive but these are presumably cross reactivity related to acute HBV. Awaiting additional labs to look for autoimmune / genetic causes of liver disease  -Appreciate ID assistance -waiting HCV RNA PCR ,HBV DNA quant, Hepatitis D RNA -Pertinent studies so far :  3.75 liter LVP, no SBP. Hepatic and portal veins unremarkable on liver doppler study.   2. Microcytic Anemia, no overt GI bleeding. Hgb stable. 9-10 range.   SUBJECTIVE   No specific complaints  OBJECTIVE:     Vital signs in last 24 hours: Temp:  [97.5 F (36.4 C)-98.9 F (37.2 C)] 97.7 F (36.5 C) (05/08 0554) Pulse Rate:  [81-84] 84 (05/08 0554) Resp:  [17-19] 18 (05/08 0554) BP: (112-119)/(68-77) 117/73 (05/08 0554) SpO2:  [97 %-98 %] 97 % (05/08 0554) Last BM Date: 08/15/18 General:   Alert, male in NAD Heart:  Regular rate and rhythm, 2 BLE edema   Pulm: Normal respiratory effor Abdomen:  Soft, distended, nontender.  Normal bowel sounds,.     Neurologic:  Alert , slow mentation, + asterixis Psych:  cooperative.   Intake/Output from previous day: 05/07 0701 - 05/08 0700 In: -  Out: 1200 [Urine:1200] Intake/Output this shift: Total I/O In: 180 [P.O.:180] Out: -   Lab Results: Recent Labs    08/13/18 0306 08/14/18 0340 08/15/18 0349  WBC 6.9 7.1 8.7  HGB 9.1* 8.4* 9.9*  HCT 27.7* 25.8* 29.9*  PLT 301 297 317   BMET Recent Labs      08/13/18 0306 08/14/18 0340 08/15/18 0349  NA 125* 126* 123*  K 4.2 4.1 4.3  CL 98 99 95*  CO2 19* 21* 20*  GLUCOSE 101* 88 99  BUN 14 16 18   CREATININE 0.66 0.58* 0.76  CALCIUM 7.9* 8.0* 8.2*   LFT Recent Labs    08/15/18 0349  PROT 8.8*  ALBUMIN 2.2*  AST 1,373*  ALT 605*  ALKPHOS 121  BILITOT 29.3*  BILIDIR 17.3*  IBILI 12.0*   PT/INR Recent Labs    08/14/18 0340 08/15/18 0349  LABPROT 33.9* 37.3*  INR 3.4* 3.9*   Hepatitis Panel No results for input(s): HEPBSAG, HCVAB, HEPAIGM, HEPBIGM in the last 72 hours.  US Liver Doppler  Result Date: 08/13/2018 CLINICAL DATA:  Cirrhosis, ascites and hepatic failure. EXAM: DUPLEX ULTRASOUND OF LIVER TECHNIQUE: Color and duplex Doppler ultrasound was performed to evaluate the hepatic in-flow and out-flow vessels. COMPARISON:  Right upper quadrant abdominal ultrasound on 08/11/2018 at [facility] FINDINGS: Portal Vein Velocities Main:  30 cm/sec Right:  19 cm/sec Left:  24 cm/sec There is no evidence of portal vein thrombus. Direction of portal vein flow is in a normal direction towards the liver. Waveforms are unremarkable and there is no significant reduction in portal vein velocities. Hepatic Vein Velocities Right:  51 cm/sec Middle:  43 cm/sec Left:  58 cm/sec No evidence of hepatic veno-occlusive disease. Hepatic vein waveforms and velocities are normal. Hepatic Artery Velocity:  186 cm/sec Splenic Vein Velocity:  73 cm/sec. No evidence of splenic vein thrombosis. Varices: No visualized varices. Ascites: Ascites present. The spleen is moderately enlarged with estimated volume of 602 mL. The liver again demonstrates evidence of cirrhosis without focal mass or evidence of intrahepatic biliary ductal dilatation. IMPRESSION: 1. Patent portal vein with normal direction of flow and no significant reduction in velocities. 2. Normally patent hepatic veins. 3. Moderate splenomegaly with estimated volume of 602 mL. 4. Evidence of  cirrhosis without evidence of hepatic mass or intrahepatic biliary ductal dilatation by ultrasound. 5. Ascites. Electronically Signed   By: Irish LackGlenn  Yamagata M.D.   On: 08/13/2018 13:10   Ir Paracentesis  Result Date: 08/13/2018 INDICATION: Ascites secondary to alcoholic cirrhosis and hepatitis C infection. Request for diagnostic and therapeutic paracentesis. EXAM: ULTRASOUND GUIDED PARACENTESIS MEDICATIONS: 1% lidocaine 10 mL COMPLICATIONS: None immediate. PROCEDURE: Informed written consent was obtained from the patient after a discussion of the risks, benefits and alternatives to treatment. A timeout was performed prior to the initiation of the procedure. Initial ultrasound scanning demonstrates a large amount of ascites within the left lower abdominal quadrant. The left lower abdomen was prepped and draped in the usual sterile fashion. 1% lidocaine with epinephrine was used for local anesthesia. Following this, a 19 gauge, 7-cm, Yueh catheter was introduced. An ultrasound image was saved for documentation purposes. The paracentesis was performed. The catheter was removed and a dressing was applied. The patient tolerated the procedure well without immediate post procedural complication. FINDINGS: A total of approximately 3.75 L of clear yellow fluid was removed. Samples were sent to the laboratory as requested by the clinical team. IMPRESSION: Successful ultrasound-guided paracentesis yielding 3.75 liters of peritoneal fluid. Read by: Corrin ParkerWendy Blair, PA-C Electronically Signed   By: Judie PetitM.  Shick M.D.   On: 08/13/2018 12:00    Principal Problem:   Acute hepatic failure Active Problems:   Elevated LFTs   Ascites   Coagulopathy (HCC)   Polysubstance abuse (HCC)   Tobacco dependence     LOS: 3 days   Willette ClusterPaula Kaylina Cahue ,NP 08/15/2018, 9:51 AM

## 2018-08-16 ENCOUNTER — Encounter (HOSPITAL_COMMUNITY): Payer: Self-pay | Admitting: *Deleted

## 2018-08-16 ENCOUNTER — Encounter (HOSPITAL_COMMUNITY): Payer: Self-pay | Admitting: Anesthesiology

## 2018-08-16 ENCOUNTER — Encounter (HOSPITAL_COMMUNITY): Admission: EM | Disposition: E | Payer: Self-pay | Source: Other Acute Inpatient Hospital | Attending: Family Medicine

## 2018-08-16 DIAGNOSIS — K7290 Hepatic failure, unspecified without coma: Secondary | ICD-10-CM

## 2018-08-16 DIAGNOSIS — Z515 Encounter for palliative care: Secondary | ICD-10-CM

## 2018-08-16 DIAGNOSIS — Z66 Do not resuscitate: Secondary | ICD-10-CM

## 2018-08-16 DIAGNOSIS — Z7189 Other specified counseling: Secondary | ICD-10-CM

## 2018-08-16 DIAGNOSIS — K920 Hematemesis: Secondary | ICD-10-CM

## 2018-08-16 LAB — CBC
HCT: 29.7 % — ABNORMAL LOW (ref 39.0–52.0)
Hemoglobin: 9.6 g/dL — ABNORMAL LOW (ref 13.0–17.0)
MCH: 24.3 pg — ABNORMAL LOW (ref 26.0–34.0)
MCHC: 32.3 g/dL (ref 30.0–36.0)
MCV: 75.2 fL — ABNORMAL LOW (ref 80.0–100.0)
Platelets: 323 10*3/uL (ref 150–400)
RBC: 3.95 MIL/uL — ABNORMAL LOW (ref 4.22–5.81)
RDW: 31.3 % — ABNORMAL HIGH (ref 11.5–15.5)
WBC: 11.9 10*3/uL — ABNORMAL HIGH (ref 4.0–10.5)
nRBC: 0.3 % — ABNORMAL HIGH (ref 0.0–0.2)

## 2018-08-16 LAB — HEMOGLOBIN AND HEMATOCRIT, BLOOD
HCT: 29.4 % — ABNORMAL LOW (ref 39.0–52.0)
Hemoglobin: 9.5 g/dL — ABNORMAL LOW (ref 13.0–17.0)

## 2018-08-16 LAB — HEPATIC FUNCTION PANEL
ALT: 545 U/L — ABNORMAL HIGH (ref 0–44)
AST: 1143 U/L — ABNORMAL HIGH (ref 15–41)
Albumin: 2 g/dL — ABNORMAL LOW (ref 3.5–5.0)
Alkaline Phosphatase: 113 U/L (ref 38–126)
Bilirubin, Direct: 16.5 mg/dL — ABNORMAL HIGH (ref 0.0–0.2)
Indirect Bilirubin: 10.2 mg/dL — ABNORMAL HIGH (ref 0.3–0.9)
Total Bilirubin: 26.7 mg/dL (ref 0.3–1.2)
Total Protein: 8.4 g/dL — ABNORMAL HIGH (ref 6.5–8.1)

## 2018-08-16 LAB — BASIC METABOLIC PANEL
Anion gap: 17 — ABNORMAL HIGH (ref 5–15)
BUN: 23 mg/dL — ABNORMAL HIGH (ref 6–20)
CO2: 19 mmol/L — ABNORMAL LOW (ref 22–32)
Calcium: 8.2 mg/dL — ABNORMAL LOW (ref 8.9–10.3)
Chloride: 88 mmol/L — ABNORMAL LOW (ref 98–111)
Creatinine, Ser: 0.99 mg/dL (ref 0.61–1.24)
GFR calc Af Amer: >60 mL/min (ref >60)
GFR calc non Af Amer: >60 mL/min (ref >60)
Glucose, Bld: 50 mg/dL — ABNORMAL LOW (ref 70–99)
Potassium: 5 mmol/L (ref 3.5–5.1)
Sodium: 124 mmol/L — ABNORMAL LOW (ref 135–145)

## 2018-08-16 LAB — GLUCOSE, CAPILLARY: Glucose-Capillary: <10 mg/dL — CL (ref 70–99)

## 2018-08-16 LAB — PROTIME-INR
INR: 5.1 (ref 0.8–1.2)
Prothrombin Time: 46.3 seconds — ABNORMAL HIGH (ref 11.4–15.2)

## 2018-08-16 LAB — AFP TUMOR MARKER: AFP, Serum, Tumor Marker: 8.8 ng/mL — ABNORMAL HIGH (ref 0.0–8.3)

## 2018-08-16 LAB — CMV ANTIBODY, IGG (EIA): CMV Ab - IgG: >10 U/mL — ABNORMAL HIGH (ref 0.00–0.59)

## 2018-08-16 LAB — HSV(HERPES SIMPLEX VRS) I + II AB-IGG
HSV 1 Glycoprotein G Ab, IgG: 20.4 index — ABNORMAL HIGH (ref 0.00–0.90)
HSV 2 Glycoprotein G Ab, IgG: <0.91 index (ref 0.00–0.90)

## 2018-08-16 SURGERY — EGD (ESOPHAGOGASTRODUODENOSCOPY)
Anesthesia: Monitor Anesthesia Care | Laterality: Left

## 2018-08-16 MED ORDER — PANTOPRAZOLE SODIUM 40 MG IV SOLR: 40.0000 mg | Freq: Two times a day (BID) | INTRAVENOUS | Status: DC

## 2018-08-16 MED ORDER — VITAMIN K1 10 MG/ML IJ SOLN
10.0000 mg | Freq: Once | INTRAMUSCULAR | Status: AC
  Administered 2018-08-16: 10 mg via SUBCUTANEOUS
  Filled 2018-08-16: qty 1

## 2018-08-16 MED ORDER — SODIUM CHLORIDE 0.9 % IV SOLN
50.0000 ug/h | INTRAVENOUS | Status: DC
  Administered 2018-08-16 (×2): 50 ug/h via INTRAVENOUS
  Filled 2018-08-16 (×2): qty 1

## 2018-08-16 MED ORDER — DEXTROSE 50 % IV SOLN
INTRAVENOUS | Status: AC
  Filled 2018-08-16: qty 50

## 2018-08-16 MED ORDER — HALOPERIDOL LACTATE 5 MG/ML IJ SOLN: 2.0000 mg | INTRAMUSCULAR | Status: DC | PRN

## 2018-08-16 MED ORDER — LORAZEPAM 2 MG/ML IJ SOLN
1.0000 mg | INTRAMUSCULAR | Status: DC | PRN
  Administered 2018-08-16 (×2): 2 mg via INTRAVENOUS
  Filled 2018-08-16 (×2): qty 1

## 2018-08-16 MED ORDER — SODIUM CHLORIDE 0.9 % IV SOLN
2.0000 g | Freq: Every day | INTRAVENOUS | Status: DC
  Administered 2018-08-16: 2 g via INTRAVENOUS
  Filled 2018-08-16 (×2): qty 20

## 2018-08-16 MED ORDER — METOCLOPRAMIDE HCL 5 MG/ML IJ SOLN: 10.0000 mg | Freq: Four times a day (QID) | INTRAMUSCULAR | Status: DC | PRN

## 2018-08-16 MED ORDER — SODIUM CHLORIDE 0.9 % IV SOLN
8.0000 mg/h | INTRAVENOUS | Status: DC
  Administered 2018-08-16: 8 mg/h via INTRAVENOUS
  Filled 2018-08-16 (×2): qty 80

## 2018-08-16 MED ORDER — HYDROMORPHONE HCL 1 MG/ML IJ SOLN
1.0000 mg | INTRAMUSCULAR | Status: DC | PRN
  Administered 2018-08-16 (×2): 2 mg via INTRAVENOUS
  Administered 2018-08-16: 1 mg via INTRAVENOUS
  Filled 2018-08-16 (×2): qty 2
  Filled 2018-08-16: qty 1

## 2018-08-16 MED ORDER — BIOTENE DRY MOUTH MT LIQD: 15.0000 mL | OROMUCOSAL | Status: DC | PRN

## 2018-08-16 MED ORDER — HALOPERIDOL LACTATE 5 MG/ML IJ SOLN: 5.0000 mg | Freq: Once | INTRAMUSCULAR | Status: DC

## 2018-08-16 MED ORDER — DEXTROSE 50 % IV SOLN
25.0000 g | INTRAVENOUS | Status: AC
  Administered 2018-08-16: 25 g via INTRAVENOUS

## 2018-08-16 MED ORDER — HALOPERIDOL LACTATE 5 MG/ML IJ SOLN
5.0000 mg | Freq: Once | INTRAMUSCULAR | Status: AC
  Administered 2018-08-16: 5 mg via INTRAMUSCULAR
  Filled 2018-08-16: qty 1

## 2018-08-16 MED ORDER — SODIUM CHLORIDE 0.9 % IV SOLN
80.0000 mg | Freq: Once | INTRAVENOUS | Status: AC
  Administered 2018-08-16: 80 mg via INTRAVENOUS
  Filled 2018-08-16: qty 80

## 2018-08-16 MED ORDER — POLYVINYL ALCOHOL 1.4 % OP SOLN
1.0000 [drp] | Freq: Four times a day (QID) | OPHTHALMIC | Status: DC | PRN
  Filled 2018-08-16: qty 15

## 2018-08-16 MED ORDER — GLYCOPYRROLATE 0.2 MG/ML IJ SOLN
0.3000 mg | INTRAMUSCULAR | Status: DC | PRN
  Administered 2018-08-16 (×3): 0.3 mg via INTRAVENOUS
  Filled 2018-08-16 (×3): qty 2

## 2018-08-16 NOTE — Progress Notes (Signed)
Bilateral soft wrist restraints has been applied along with soft waist belt.  Will continue to monitor the patient and notify as needed

## 2018-08-16 NOTE — Progress Notes (Signed)
Pt with coffee ground emesis throughout the night despite ongoing treatment with antiemetics. ABD X-ray and H&H ordered. Unable to place NGT due to possible varices. Spoke with GI who recommended starting an Octreotide gtt, and Protonix gtt. Also to make the pt NPO for an EGD in the morning. Attempted to contact pts sister were unsuccessful. Appreciate GI recommendations.  Stevie Kern AGPCNP-BC, AGNP-C [facility] Pager [phone]

## 2018-08-16 NOTE — Progress Notes (Signed)
PROGRESS NOTE    Mark Weeks  BTD:[id number] DOB: [birth year] DOA: 08-22-18 PCP: No primary care provider on file.      Brief Narrative:  Mr. Arnott is a 48 y.o. M with hx EtOH abuse and IVDU who presents with 1-2 weeks progressive jaundice and abdominal swelling.      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  Appears to have hepatitis B and C, possibly acute hep B.  Family report relatively little alcohol, but active methamphetamine use.  Here, GI was consulted.  Underwent paracentesis showed no SBP.  Started on diuretics, but fluid overload and ascites worsened.  Started on NAC for liver failure, tenofovir for hep B.  Started on lactulose but encephalopathy worsened.  Started on vitamin K repeatedly but INR worsening today.  Case discussed with Hepatology at [facility], patient is not a transplant candidate.  Overnight last night, encephalopathy worsening, very confused today.  Also coffee ground emesis overnight. -Palliative care consulted, appreciate cares -Agree with full comfort measures    Microcytic anemia   Hepatitis C Ab positive Hepatitis B infection  Substance use disorder  Hyponatremia No significant change, hypervolemic.   SARS-CoV-2 testing was obtained for screening purposes.  COVID ruled out.         MDM and disposition: The below labs and imaging reports reviewed and summarized above.  Medication management as above.  The patient was admitted with decompensated hepatic cirrhosis.  His prognosis was extremely poor at the time of his admission, and he appears to be failing medical treatment.  He is not a liver transplant candidate, in light of his rapid decline in the last 48 hours, suspect he will have been in hospital death.  Appreciate  palliative cares.   DVT prophylaxis: SCDs Code Status: FULL     Consultants:   GI  IR  Procedures:   Paracentesis 5/6  Antimicrobials:   Ceftriaxone 5/5>>5/6    Subjective: Coffee-ground emesis overnight.  Pulled out all IVs this morning, required soft restraints.  Very confused, hard to redirect, does not follow commands.      Objective: Vitals:   08/08/2018 0340 08/23/2018 0400 08/24/2018 0622 08/10/2018 0807  BP: 107/62  102/62 (!) 106/46  Pulse: (!) 103   94  Resp: 18  17 16   Temp:  97.8 F (36.6 C)  (!) 97.3 F (36.3 C)  TempSrc:    Oral  SpO2: 92%   92%  Weight:      Height:        Intake/Output Summary (Last 24 hours) at 09/04/2018 1625 Last data filed at 08/28/2018 1500 Gross per 24 hour  Intake 435.65 ml  Output 800 ml  Net -364.35 ml   Filed Weights   2018/08/22 1234  Weight: 79.5 kg    Examination: General appearance: Jaundiced, confused adult male, lying in bed, in soft restraints.Marland Kitchen HEENT: Icteric, dried bloody emesis on lips, nose from gums. Skin: Severe jaundice Cardiac: Tachycardic, edematous. respiratory: Gwyndolyn Kaufman effort weak, lung sounds diminished, no rales or wheezes appreciated. Abdomen: Abdomen tense with ascites.  Diffuse tenderness.  Voluntary guarding throughout MSK: No deformities or effusions. Neuro: Sluggishly arouses to voice, and then falls back asleep.  Does not follow commands. Psych: Unable to assess    Data Reviewed: I have personally reviewed following labs and imaging studies:  CBC: Recent Labs  Lab  2019/02/27 0902 08/13/18 0306 08/14/18 0340 08/15/18 0349 08/15/18 2358 08/10/2018 0043  WBC 6.7 6.9 7.1 8.7  --  11.9*  NEUTROABS 4.4  --   --   --   --   --   HGB 9.7* 9.1* 8.4* 9.9* 9.5* 9.6*  HCT 29.7* 27.7* 25.8* 29.9* 29.4* 29.7*  MCV 73.3* 72.3* 72.9* 74.0*  --  75.2*  PLT 329 301 297 317  --  323   Basic Metabolic Panel: Recent Labs  Lab 2019/02/27 0902 08/13/18 0306 08/14/18 0340 08/15/18 0349 08/31/2018 0043   NA 128* 125* 126* 123* 124*  K 3.7 4.2 4.1 4.3 5.0  CL 102 98 99 95* 88*  CO2 20* 19* 21* 20* 19*  GLUCOSE 78 101* 88 99 50*  BUN 10 14 16 18  23*  CREATININE 0.61 0.66 0.58* 0.76 0.99  CALCIUM 7.9* 7.9* 8.0* 8.2* 8.2*   GFR: Estimated Creatinine Clearance: 87.7 mL/min (by C-G formula based on SCr of 0.99 mg/dL). Liver Function Tests: Recent Labs  Lab 2019/02/27 0902 08/13/18 0306 08/14/18 0340 08/15/18 0349 08/15/2018 0043  AST 2,005* 1,791* 1,466* 1,373* 1,143*  ALT 846* 731* 642* 605* 545*  ALKPHOS 139* 131* 116 121 113  BILITOT 24.8* 23.7* 25.4* 29.3* 26.7*  PROT 8.4* 8.2* 8.2* 8.8* 8.4*  ALBUMIN 1.9* 1.8* 2.0* 2.2* 2.0*   No results for input(s): LIPASE, AMYLASE in the last 168 hours. Recent Labs  Lab 2019/02/27 1040  AMMONIA 51*   Coagulation Profile: Recent Labs  Lab 2019/02/27 1040 08/13/18 0306 08/14/18 0340 08/15/18 0349 08/22/2018 0043  INR 3.2* 3.4* 3.4* 3.9* 5.1*   Cardiac Enzymes: No results for input(s): CKTOTAL, CKMB, CKMBINDEX, TROPONINI in the last 168 hours. BNP (last 3 results) No results for input(s): PROBNP in the last 8760 hours. HbA1C: No results for input(s): HGBA1C in the last 72 hours. CBG: No results for input(s): GLUCAP in the last 168 hours. Lipid Profile: No results for input(s): CHOL, HDL, LDLCALC, TRIG, CHOLHDL, LDLDIRECT in the last 72 hours. Thyroid Function Tests: No results for input(s): TSH, T4TOTAL, FREET4, T3FREE, THYROIDAB in the last 72 hours. Anemia Panel: No results for input(s): VITAMINB12, FOLATE, FERRITIN, TIBC, IRON, RETICCTPCT in the last 72 hours. Urine analysis: No results found for: COLORURINE, APPEARANCEUR, LABSPEC, PHURINE, GLUCOSEU, HGBUR, BILIRUBINUR, KETONESUR, PROTEINUR, UROBILINOGEN, NITRITE, LEUKOCYTESUR Sepsis Labs: @LABRCNTIP (procalcitonin:4,lacticacidven:4)  ) Recent Results (from the past 240 hour(s))  SARS Coronavirus 2 (CEPHEID - Performed in [facility]), Hosp Order     Status: None    Collection Time: 2019/02/27  7:56 AM  Result Value Ref Range Status   SARS Coronavirus 2 NEGATIVE NEGATIVE Final    Comment: (NOTE) If result is NEGATIVE SARS-CoV-2 target nucleic acids are NOT DETECTED. The SARS-CoV-2 RNA is generally detectable in upper and lower  respiratory specimens during the acute phase of infection. The lowest  concentration of SARS-CoV-2 viral copies this assay can detect is 250  copies / mL. A negative result does not preclude SARS-CoV-2 infection  and should not be used as the sole basis for treatment or other  patient management decisions.  A negative result may occur with  improper specimen collection / handling, submission of specimen other  than nasopharyngeal swab, presence of viral mutation(s) within the  areas targeted by this assay, and inadequate number of viral copies  (<250 copies / mL). A negative result must be combined with clinical  observations, patient history, and epidemiological information. If result is POSITIVE SARS-CoV-2 target nucleic acids are DETECTED.  The SARS-CoV-2 RNA is generally detectable in upper and lower  respiratory specimens dur ing the acute phase of infection.  Positive  results are indicative of active infection with SARS-CoV-2.  Clinical  correlation with patient history and other diagnostic information is  necessary to determine patient infection status.  Positive results do  not rule out bacterial infection or co-infection with other viruses. If result is PRESUMPTIVE POSTIVE SARS-CoV-2 nucleic acids MAY BE PRESENT.   A presumptive positive result was obtained on the submitted specimen  and confirmed on repeat testing.  While 2019 novel coronavirus  (SARS-CoV-2) nucleic acids may be present in the submitted sample  additional confirmatory testing may be necessary for epidemiological  and / or clinical management purposes  to differentiate between  SARS-CoV-2 and other Sarbecovirus currently known to infect humans.   If clinically indicated additional testing with an alternate test  methodology [phone]) is advised. The SARS-CoV-2 RNA is generally  detectable in upper and lower respiratory sp ecimens during the acute  phase of infection. The expected result is Negative. Fact Sheet for Patients:  BoilerBrush.com.cy Fact Sheet for Healthcare Providers: https://pope.com/ This test is not yet approved or cleared by the [place] FDA and has been authorized for detection and/or diagnosis of SARS-CoV-2 by FDA under an Emergency Use Authorization (EUA).  This EUA will remain in effect (meaning this test can be used) for the duration of the COVID-19 declaration under Section 564(b)(1) of the Act, 21 U.S.C. section 360bbb-3(b)(1), unless the authorization is terminated or revoked sooner. Performed at [facility], [address] [place], [place] [address]   Gram stain     Status: None   Collection Time: 08/13/18 11:29 AM  Result Value Ref Range Status   Specimen Description PERITONEAL CAVITY  Final   Special Requests NONE  Final   Gram Stain   Final    CYTOSPIN SMEAR WBC PRESENT, PREDOMINANTLY MONONUCLEAR NO ORGANISMS SEEN Performed at [facility], [address] [place], [place] [address]    Report Status 08/13/2018 FINAL  Final  Culture, body fluid-bottle     Status: None (Preliminary result)   Collection Time: 08/13/18 11:30 AM  Result Value Ref Range Status   Specimen Description PERITONEAL  Final   Special Requests FLUID  Final   Culture   Final    NO GROWTH 2 DAYS Performed at [facility], [address] [place], [place] [address]    Report Status PENDING  Incomplete         Radiology Studies: Dg Abd Portable 1v  Result Date: 2018-09-08 CLINICAL DATA:  48 year old male with coffee-ground emesis. EXAM: PORTABLE ABDOMEN - 1 VIEW COMPARISON:  None. FINDINGS: Evaluation is limited due to body habitus. There is  moderate amount of air within the colon as well as stool in the proximal colon. Mildly dilated air-filled small bowel loops measure up to 3.4 cm in the left hemiabdomen. No free air or radiopaque calculi. Probable ascites. There are bibasilar atelectasis. The osseous structures are grossly unremarkable. IMPRESSION: Mildly dilated air-filled small bowel loops in the left hemiabdomen. No free air. Electronically Signed   By: Elgie Collard M.D.   On: 2018/09/08 00:25        Scheduled Meds: . nicotine  14 mg Transdermal Daily   Continuous Infusions: . sodium chloride 10 mL/hr at 09-08-18 1500     LOS: 4 days    Time spent: 25 minutes    Alberteen Sam, MD [facility] 08/14/2018, 4:25 PM     Please page through AMION:  [email or web address] If 7PM-7AM, please contact night-coverage

## 2018-08-16 NOTE — Progress Notes (Signed)
Pt 's sister reported that mr. Mark Weeks is allergic to Haldol ( reaction or severity unknown). Allergy list updated and provider notified to D/C medication

## 2018-08-16 NOTE — Anesthesia Preprocedure Evaluation (Deleted)
Anesthesia Evaluation    Reviewed: Allergy & Precautions, Patient's Chart, lab work & pertinent test results  History of Anesthesia Complications Negative for: history of anesthetic complications  Airway        Dental   Pulmonary Current Smoker,           Cardiovascular      Neuro/Psych negative neurological ROS  negative psych ROS   GI/Hepatic (+)     substance abuse  alcohol use, marijuana use and methamphetamine use, Hepatitis - Hematemesis    Endo/Other   Hyponatremia Hypocalcemia Hypochloremia  Renal/GU negative Renal ROS     Musculoskeletal negative musculoskeletal ROS (+)   Abdominal   Peds  Hematology  (+) anemia ,   Anesthesia Other Findings   Reproductive/Obstetrics                             Anesthesia Physical Anesthesia Plan  ASA: IV  Anesthesia Plan: General   Post-op Pain Management:    Induction: Intravenous and Rapid sequence  PONV Risk Score and Plan: 2 and Treatment may vary due to age or medical condition and Ondansetron  Airway Management Planned: Oral ETT  Additional Equipment: None  Intra-op Plan:   Post-operative Plan: Extubation in OR  Informed Consent:   Plan Discussed with: CRNA and Anesthesiologist  Anesthesia Plan Comments:         Anesthesia Quick Evaluation

## 2018-08-16 NOTE — Progress Notes (Addendum)
Spoke with the patient's sister, Mark Weeks [phone]) regarding overnight events of coffee-ground emesis, increased confusion/agitation, pulling out his IVs.  Additionally, we discussed his uptrending INR to 5.1.  H/H otherwise largely stable, and no further emesis early this morning.  Explained to her and her husband that Mark Weeks is quite sick, as characterized by his increasing confusion/encephalopathy, coagulopathy.  Did have a mild downtrend in his T bili, but I remain quite concerned about his worsening liver failure.  Discussed the need for upper endoscopy for suspected upper GI bleed.  Discussed the possible sources, to include esophageal varices, PUD, diffuse gastric oozing in the setting of significant coagulopathy.  She provides consent for EGD as well as temporary suspension of his DNR and that perioperative period.  Additionally, discussed his overall prognosis, and explained that he is not a transplant candidate due to active drug use prior to admission, which she understands well.  Additionally explained his continued deterioration despite our multimodality efforts, and that his prognosis is quite grave at this juncture, which she also understands well.  She has good insight into his current medical situation and asks very pertinent questions, all of which were answered to the best of my capabilities.  - Ordered for dose of vitamin K 10 mg SQ now - Reestablishing IV access - When IV started, plan for Protonix 40 mg IV twice daily and octreotide gtt. -Continue lactulose.  If not tolerating p.o., administer PR -Ceftriaxone 1 g every 24 hours -Resume NAC when IV reestablished - Resume antiviral therapy - Repeat INR at 12:00 - Every 6 hours H/H checks with PRBC transfusion per protocol - Monitor for continued GIB.  If no ongoing bleeding, H/H stable, HD stable, will plan on continue to treat his INR, as lowering that will increase endoscopic success rate.  However, if ongoing evidence of bleeding  or HD instability, will plan on emergent EGD this a.m. - Discussed the risks, benefits, alternatives of EGD with his sister, and she provides verbal consent over the phone.  Also discussed temporary suspension of DNR, as outlined above, which she agrees to suspend in the perioperative period -To follow-up pending serologies - Consider acyclovir - May consider Palliative Care consult  Doristine Locks, DO, [facility]  Addendum: Since my earlier discussion with the patient's sister, Mark Weeks, they have had an in-depth conversation with Palliative Care and have since decided to transition to comfort care.  As such no plan for aggressive interventions or maneuvers at this time.  GI service will remain available to answer questions as needed.

## 2018-08-16 NOTE — Consult Note (Signed)
Consultation Note Date: 09-07-2018   Patient Name: Mark Weeks  DOB: 05/27/1970  MRN: [id number]  Age / Sex: 48 y.o., male   PCP: No primary care provider on file. Referring Physician: Alberteen Sam, *   REASON FOR CONSULTATION:Establishing goals of care  Palliative Care consult requested for this 48 y.o. male with multiple medical problems including alcoholism, liver cirrhosis, polysubstance abuse (amphetamines/marijuana).  He was admitted with complaints of abdominal pain, bloating, generalized weakness, nausea, and jaundice.  This admission patient continues to decline despite multiple medical interventions.  He remains encephalopathic.   Clinical Assessment and Goals of Care: I have reviewed medical records including lab results, imaging, Epic notes, and MAR, received report from the bedside RN, and assessed the patient.  Spoke with patient's sister, Edmon Crape and her husband Delton See to discuss diagnosis prognosis, GOC, EOL wishes, disposition and options. Patient is confused and agitated. Will not follow commands. Requiring soft restraints after pulling out IV lines and signs of agitation.   I introduced Palliative Medicine as specialized medical care for people living with serious illness. It focuses on providing relief from the symptoms and stress of a serious illness. The goal is to improve quality of life for both the patient and the family.  We discussed a brief life review of the patient, along with functional and nutritional status.  Karen Kitchens reports her and patient are twins. They have been close in relationship all of their lives. He has worked odd and end jobs throughout the years. Both patient's sister and her husband reports his poor choices in lifestyle over the years after connecting with the wrong group of people. However, he has always maintained a supportive and respectful relationship with his sister and brother-in-law.   We discussed His current illness  and what it means in the larger context of His on-going co-morbidities. With specific discussions regarding his worsening liver failure and encephalopathy.  Natural disease trajectory and expectations at EOL were discussed. Karen Kitchens verbalized her understanding of his condition and recaps her previous conversations with GI on this morning. She states she has been thinking about her brother most of the day and does not want him to lay here and suffer.   She is tearful in conversations. Allowed her and her husband time to express their feelings and share memories.   I attempted to elicit values and goals of care important to the patient.    The difference between aggressive medical intervention and comfort care was considered in light of the patient's goals of care. Family have decided knowing his condition and poor prognosis, they would not want to continue putting him through medical procedures and interventions. Karen Kitchens expressed her wishes are for him to be comfortable and pass away in peace as much as possible.   I educated family on what comfort care measures would look like. We discussed patient would no longer receive aggressive medical interventions such as continuous vital signs, lab work, radiology testing, or medications not focused on comfort. All care will focus on how the patient is looking and feeling. This would include management of any symptoms that may cause discomfort, pain, shortness of breath, cough, nausea, agitation, anxiety, and/or secretions etc. Symptoms would be managed with medications and other non-pharmacological interventions such as spiritual support if requested, repositioning, or therapeutic listening. Family verbalized understanding and appreciation. Sister, Karen Kitchens verbalized she would like to proceed with full comfort care. She and her husband are aware to anticipate a hospital death, however,  in the event patient remains hospitalized and appears to be stable in symptom  control. He could potentially be considered for transfer to a residential hospice facility. Family verbalized understanding.   Given his transition to full comfort and anticipated hospital death, family aware they will be allowed to visit and support patient during this critical time.  Briefly screened family members for any obvious signs and symptoms of COVID 19.  Both sister and husband denied any symptoms or recent exposure to known individuals who are positive for COVID or with symptoms.   Sister, Karen Kitchens confirms DNR/DNI.  Questions and concerns were addressed. The family was encouraged to call with questions or concerns.  PMT will continue to support holistically.   SOCIAL HISTORY:     reports that he has been smoking. He has been smoking about 1.00 pack per day. He has never used smokeless tobacco. He reports previous alcohol use. He reports current drug use. Drugs: Amphetamines and Marijuana.  CODE STATUS: DNR  ADVANCE DIRECTIVES: Primary Decision Maker: Edmon Crape (Sister)  HCPOA: YES   SYMPTOM MANAGEMENT:  Haldol PRN for agitation/anxiety  Ativan PRN for agitation/anxiety (given patient's substance abuse and agitation may require aggressive management).  May require drip if frequent administration is needed  Dilaudid for comfort/shortness of breath (May require drip if symptoms are unmanaged and/or frequent administration over next 24hrs)  Robinul PRN for excessive secretions  Liquifilm as needed for dry eyes  Palliative Prophylaxis:   Aspiration, Frequent Pain Assessment and Oral Care  PSYCHO-SOCIAL/SPIRITUAL:  Support System: Family   Desire for further Chaplaincy support: Yes   Additional Recommendations (Limitations, Scope, Preferences):  Full Comfort Care   PAST MEDICAL HISTORY: Past Medical History:  Diagnosis Date   Alcohol dependence (HCC)    in remission x 6 years   Polysubstance abuse (HCC)    amphetamines, marijuana    PAST SURGICAL  HISTORY:  Past Surgical History:  Procedure Laterality Date   IR PARACENTESIS  08/13/2018    ALLERGIES:  has No Known Allergies.   MEDICATIONS:  Current Facility-Administered Medications  Medication Dose Route Frequency Provider Last Rate Last Dose   0.9 %  sodium chloride infusion   Intravenous PRN Alberteen Sam, MD   Stopped at 08/13/18 2135   nicotine (NICODERM CQ - dosed in mg/24 hours) patch 14 mg  14 mg Transdermal Daily Jonah Blue, MD   14 mg at 08/23/2018 1033   octreotide (SANDOSTATIN) 500 mcg in sodium chloride 0.9 % 250 mL (2 mcg/mL) infusion  50 mcg/hr Intravenous Continuous Macon Large, NP 25 mL/hr at 09/05/2018 0213 50 mcg/hr at 08/28/2018 0213   ondansetron (ZOFRAN) injection 4 mg  4 mg Intravenous Q6H PRN Jonah Blue, MD   4 mg at 08/15/18 1750   [START ON 08/19/2018] pantoprazole (PROTONIX) injection 40 mg  40 mg Intravenous Q12H Bodenheimer, Charles A, NP       promethazine (PHENERGAN) injection 12.5 mg  12.5 mg Intravenous Q6H PRN Alberteen Sam, MD   12.5 mg at 08/15/18 1319   Or   promethazine (PHENERGAN) suppository 25 mg  25 mg Rectal Q6H PRN Danford, Earl Lites, MD        VITAL SIGNS: BP (!) 106/46 (BP Location: Right Arm)    Pulse 94    Temp (!) 97.3 F (36.3 C) (Oral)    Resp 16    Ht  (1.651 m)    Wt 79.5 kg    SpO2 92%    BMI 29.17  kg/m  Filed Weights   08/09/2018 1234  Weight: 79.5 kg    Estimated body mass index is 29.17 kg/m as calculated from the following:   Height as of this encounter: 5\' 5"  (1.651 m).   Weight as of this encounter: 79.5 kg.  LABS: CBC:    Component Value Date/Time   WBC 11.9 (H) 01-18-2019 0043   HGB 9.6 (L) 01-18-2019 0043   HCT 29.7 (L) 01-18-2019 0043   PLT 323 01-18-2019 0043   Comprehensive Metabolic Panel:    Component Value Date/Time   NA 124 (L) 01-18-2019 0043   K 5.0 01-18-2019 0043   CO2 19 (L) 01-18-2019 0043   BUN 23 (H) 01-18-2019 0043   CREATININE 0.99  01-18-2019 0043   ALBUMIN 2.0 (L) 01-18-2019 0043     Review of Systems  Unable to perform ROS: Acuity of condition     Physical Exam Vitals signs and nursing note reviewed.  Constitutional:      General: He is awake.     Appearance: He is ill-appearing.     Comments: Chronically ill   Cardiovascular:     Rate and Rhythm: Normal rate. Rhythm irregular.     Pulses: Normal pulses.     Heart sounds: Normal heart sounds.  Pulmonary:     Effort: Pulmonary effort is normal.     Breath sounds: Decreased breath sounds present.  Abdominal:     General: Bowel sounds are decreased. There is distension.  Skin:    Coloration: Skin is jaundiced.  Neurological:     Mental Status: He is confused.     Motor: Weakness present.  Psychiatric:        Behavior: Behavior is uncooperative.        Cognition and Memory: Cognition is impaired.        Judgment: Judgment is inappropriate.   Prognosis: Hours - Days in the setting of confusion/encephalopathy, agitation, coffee-ground emesis, end-stage liver failure, alcohol abuse, IV drug use, hepatitis C, portal hypertension,anemia, and hepatitis B.   Discharge Planning:  Anticipated Hospital Death versus residential hospice   Recommendations:  DNR/DNI-as confirmed by family  Transition to full comfort care as requested by family  Family is aware of poor prognosis and anticipated hospital death.  If patient remains hospitalized and show stability and symptoms may consider transfer to residential hospice on next week.  Family members cleared for visitation for EOL support.   We will D/C orders not comfort focused  Continue Zofran/Phenergan as ordered for nausea/vomiting  Haldol PRN for agitation/anxiety  Ativan PRN for agitation/anxiety (given patient's substance abuse and agitation may require aggressive management).  May require drip if frequent administration is needed  Dilaudid for comfort/shortness of breath (May require drip if  symptoms are unmanaged and/or frequent administration over next 24hrs)  Robinul PRN for excessive secretions  Liquifilm as needed for dry eyes  We will continue octreotide for symptom management  PMT will continue to support and follow.   Palliative Performance Scale: PPS 20 %               Family expressed understanding and was in agreement with this plan.   Thank you for allowing the Palliative Medicine Team to assist in the care of this patient.  Time In: 1100 Time Out: 1230 Time Total: 90 min  Visit consisted of counseling and education dealing with the complex and emotionally intense issues of symptom management and palliative care in the setting of serious and potentially life-threatening illness.Greater than  50%  of this time was spent counseling and coordinating care related to the above assessment and plan.  Signed by:  Alda Lea, AGPCNP-BC Palliative Medicine Team  Phone: [phone] Fax: [phone] Pager: [phone] Amion: Bjorn Pippin

## 2018-08-16 NOTE — Progress Notes (Signed)
CRITICAL VALUE ALERT  Critical Value: INR 5.1  Date & Time Notied:  08/16/2018 0148  Provider Notified: Clance Boll, NO   Orders Received/Actions taken: Will await orders from NP

## 2018-08-16 NOTE — Progress Notes (Addendum)
Patient pulled out all three IV sites and continues to try to get out of bed despite being redirected. Patient is yelling at the staff and out in the hall.  Patient is also pulling off his condom cath and urinating the bed.  Currently all IV meds has been stopped due to loss of IV sites.  Notified Bodenheimer, NP.  Will continue to monitor the patient

## 2018-08-18 LAB — CULTURE, BODY FLUID W GRAM STAIN -BOTTLE

## 2018-08-18 LAB — CULTURE, BODY FLUID-BOTTLE: Culture: NO GROWTH

## 2018-08-18 LAB — EBV AB TO VIRAL CAPSID AG PNL, IGG+IGM
EBV VCA IgG: >600 U/mL — ABNORMAL HIGH (ref 0.0–17.9)
EBV VCA IgM: >160 U/mL — ABNORMAL HIGH (ref 0.0–35.9)

## 2018-08-19 LAB — ANA: Anti Nuclear Antibody (ANA): POSITIVE — AB

## 2018-08-20 LAB — HEPATITIS B DNA, ULTRAQUANTITATIVE, PCR
HBV DNA SERPL PCR-ACNC: 2470000 IU/mL
HBV DNA SERPL PCR-LOG IU: 6.393 log10 IU/mL

## 2018-08-20 LAB — HCV RT-PCR, QUANT (NON-GRAPH): Hepatitis C Quantitation: NOT DETECTED IU/mL

## 2018-08-20 LAB — HCV AB W REFLEX TO QUANT PCR: HCV Ab: >11 s/co ratio — ABNORMAL HIGH (ref 0.0–0.9)

## 2018-08-21 LAB — HCV RT-PCR, QUANT (NON-GRAPH): Hepatitis C Quantitation: NOT DETECTED IU/mL

## 2018-08-21 LAB — HCV AB W REFLEX TO QUANT PCR: HCV Ab: >11 s/co ratio — ABNORMAL HIGH (ref 0.0–0.9)

## 2018-09-08 NOTE — Discharge Summary (Signed)
Expiration Note/ Death Summary  Mark Weeks  MR#: [id number]  DOB:March 31, 1971  Date of Admission: Aug 13, 2018 Date of Death: 08-18-18  Attending Physician:Velma Agnes P Dorea Duff  Patient's PCP: No primary care provider on file.  Consults: Treatment Team:  Shellia Cleverly, DO  Palliative Care  Cause of Death: Acute hepatic failure  Secondary Diagnoses Present on Admission: . Acute hepatic failure . Tobacco dependence . Elevated LFTs    Brief H and P: For complete details please refer to admission H and P, but in brief Mark Weeks is a 48 y.o. M with hx EtOH abuse and IVDU who presented with 1-2 weeks progressive jaundice and abdominal swelling.  Found to have MELD score >30.       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  Appeared to have hepatitis B and C, possibly acute hep B.  Family report relatively little alcohol, but active methamphetamine use.  Here, GI was consulted.  Underwent paracentesis showed no SBP.  Started on diuretics, but fluid overload and ascites worsened.  Started on NAC for liver failure, tenofovir for hep B.  Started on lactulose but encephalopathy worsened.  Started on vitamin K repeatedly but INR worsening today.  Case discussed with Hepatology at [facility], patient is not a transplant candidate.  Patient with progressively worsening encephalopathy. Finally developed coffee-ground emesis and was somnolent and unable to follow commands. Palliative care discussed with family and patient's wishes would have been for comfort care, so NAC, tenofovir, diuretics, and lactulose were stopped.  Patient expired overnight that night, shortly after.      Microcytic anemia   Hepatitis C Ab positive Hepatitis B infection  Substance use  disorder  Hyponatremia       Signed:  Alberteen Sam M.D. [facility] Aug 18, 2018, 6:49 PM Pager: [phone]

## 2018-09-08 NOTE — Progress Notes (Signed)
Patient expired on 09-01-2018 at 0005. Time of death was pronounced by this writer and Norva Riffle RN.   Provider Bodenheimer C.  was notified and signed the death certificate in the unit.Pt's sister, [name] [phone] was informed about the unfortunate event. [facility] notified  of possible donor harvest . Dutch Quint reported that pt was not suitable   [place] [facility] Reference number [phone]

## 2018-09-08 DEATH — deceased

## 2020-12-12 IMAGING — US US HEPATIC LIVER DOPPLER
1 series · 13 of 25 positions shown · non-contrast
Comparison: Right upper quadrant abdominal ultrasound on 08/11/2018
at [facility]

CLINICAL DATA: Cirrhosis, ascites and hepatic failure.

EXAM:
DUPLEX ULTRASOUND OF LIVER
TECHNIQUE: Color and duplex Doppler ultrasound was performed to evaluate the
hepatic in-flow and out-flow vessels.

[Series 1: us hepatic liver doppler · 13 of 93 slices shown]
[im 1/93]
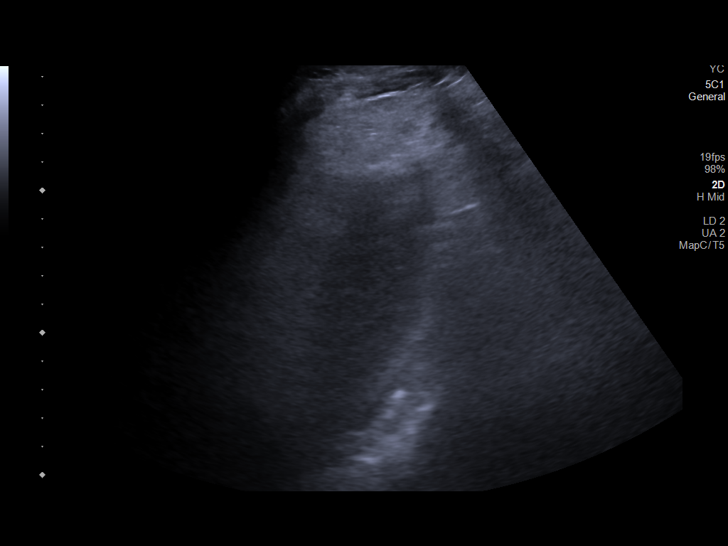
[im 8/93]
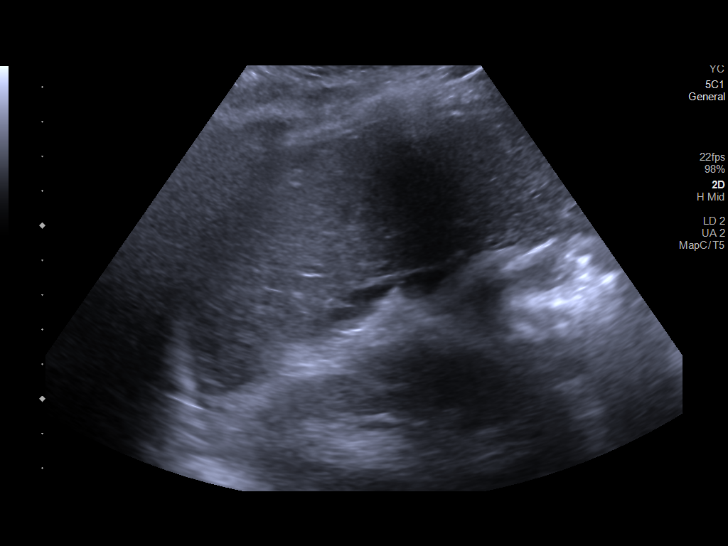
[im 16/93]
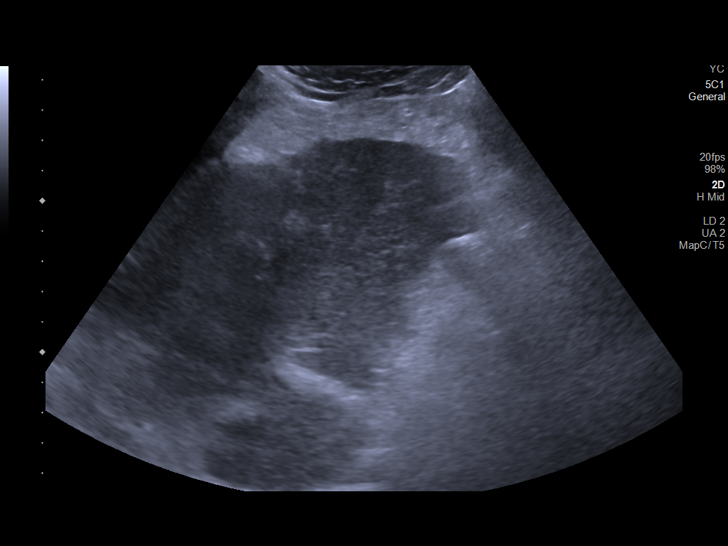
[im 24/93]
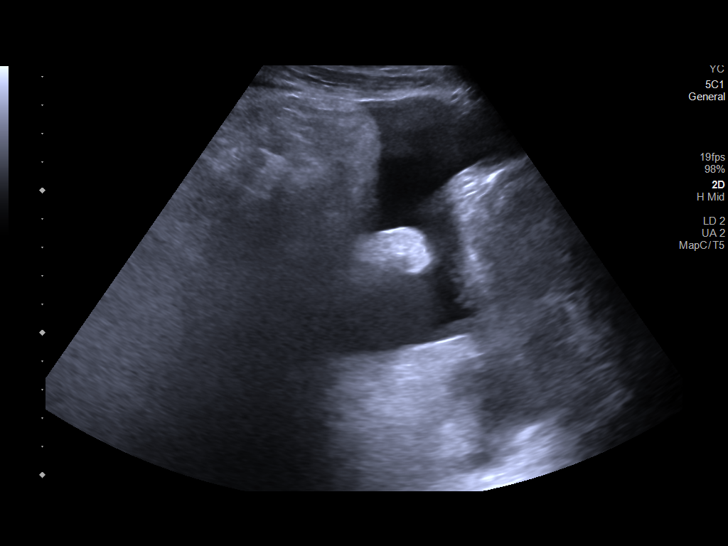
[im 31/93]
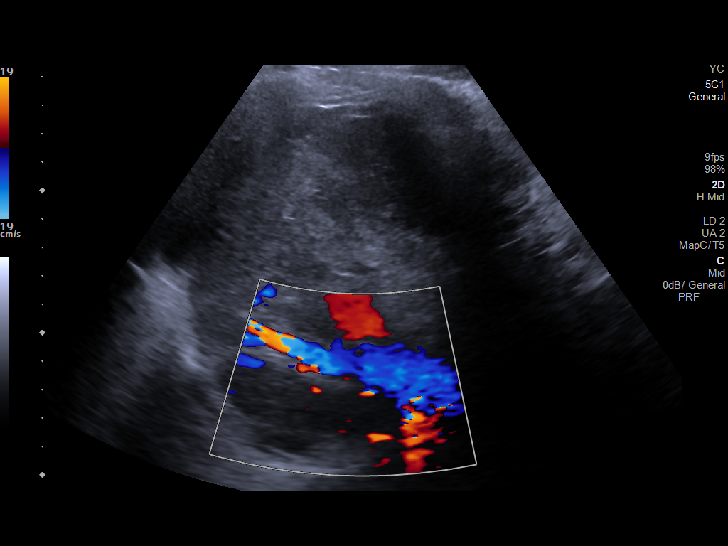
[im 39/93]
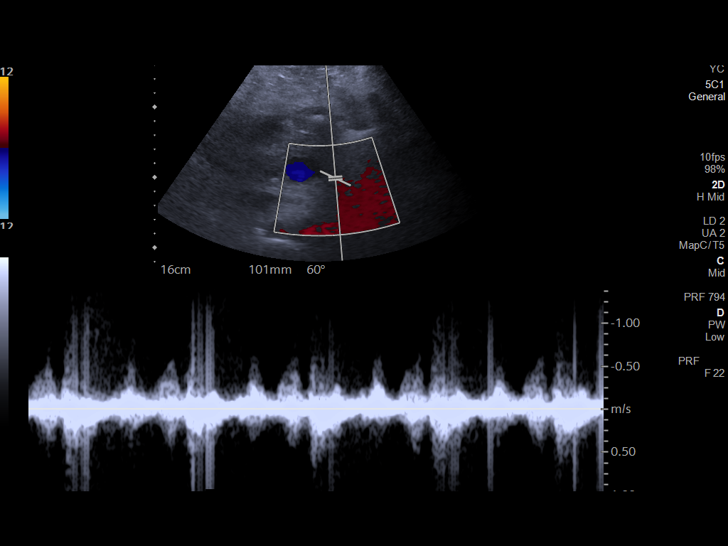
[im 47/93]
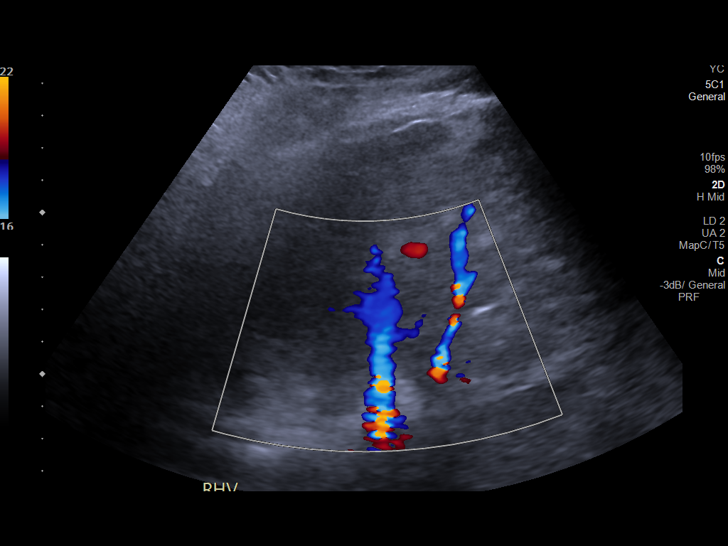
[im 54/93]
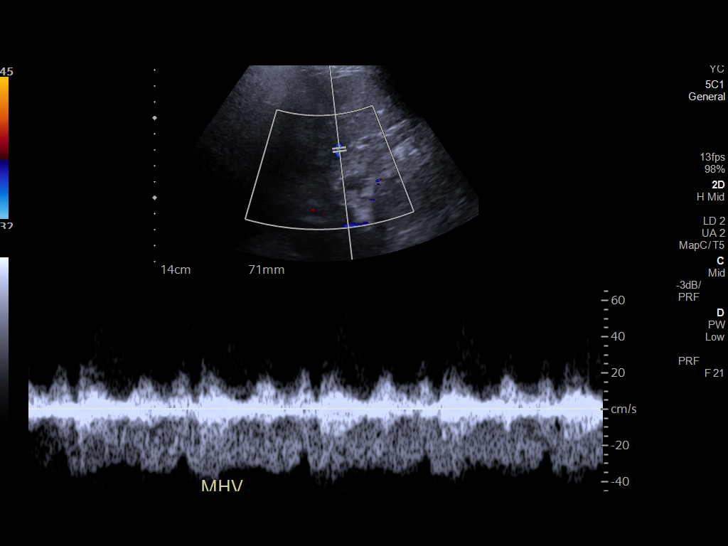
[im 62/93]
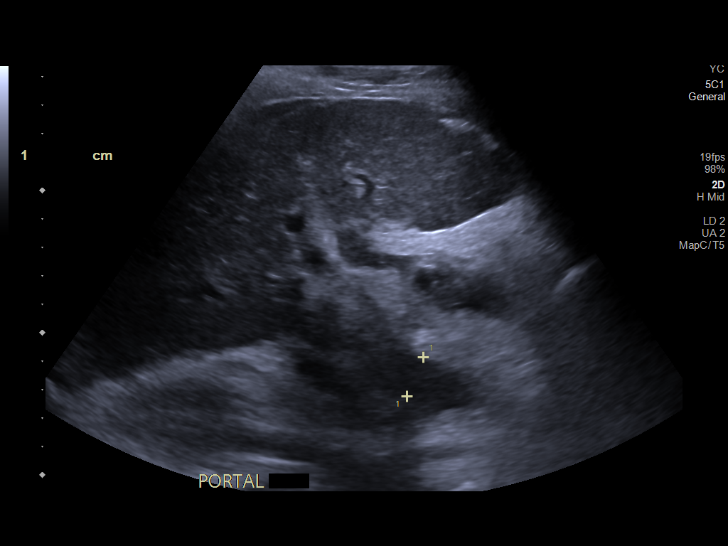
[im 70/93]
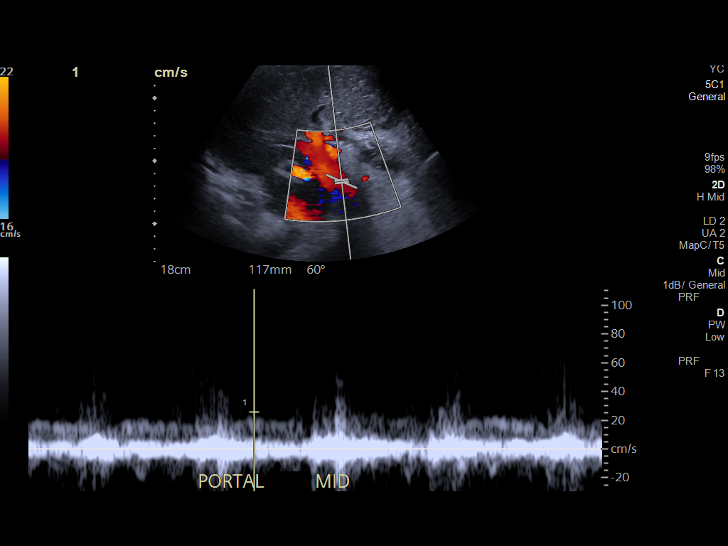
[im 77/93]
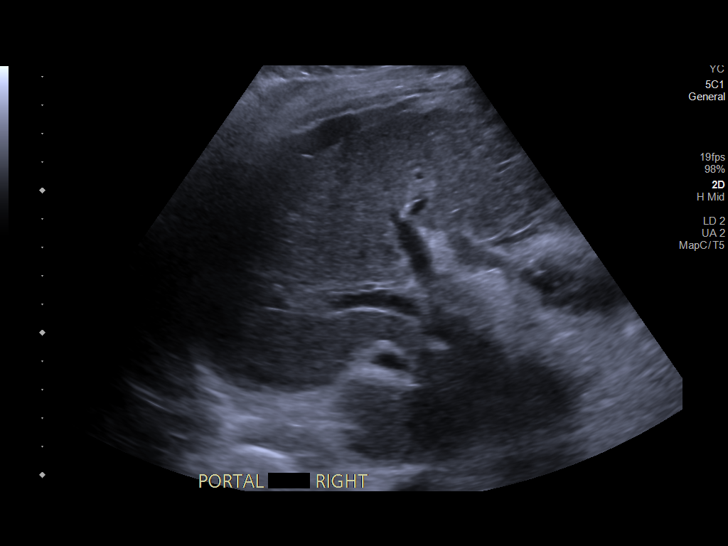
[im 85/93]
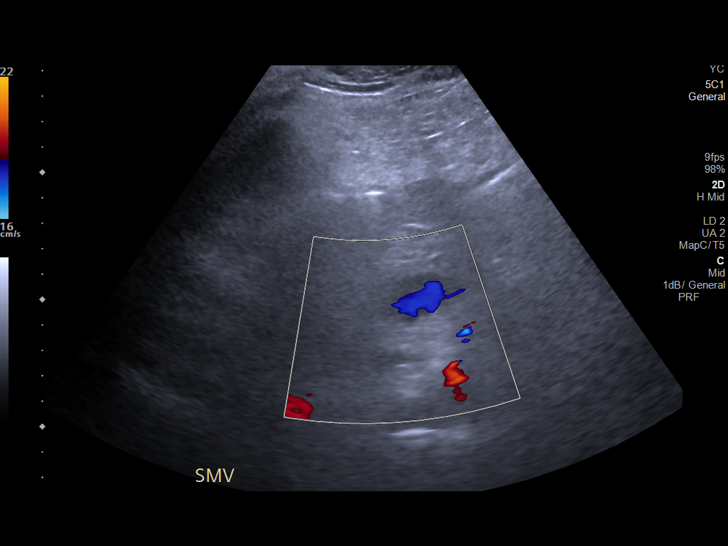
[im 93/93]
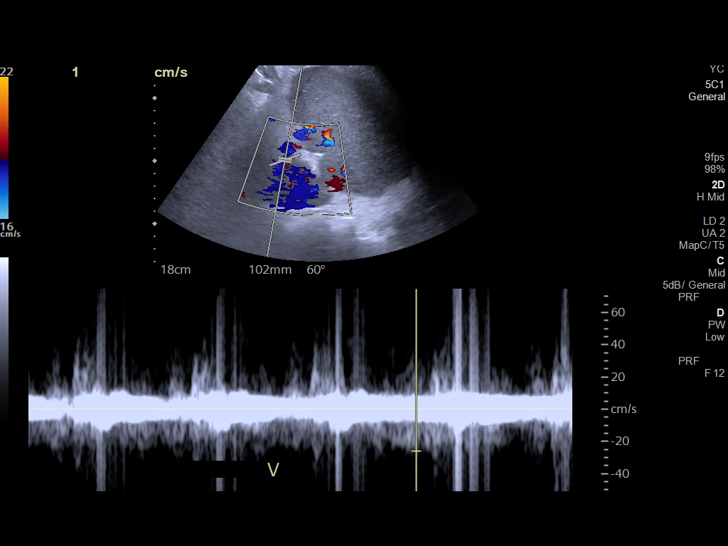

[13 of 25 positions shown; findings below may reference images not displayed]

FINDINGS: Portal Vein Velocities

Main:  30 cm/sec

Right:  19 cm/sec

Left:  24 cm/sec

There is no evidence of portal vein thrombus. Direction of portal
vein flow is in a normal direction towards the liver. Waveforms are
unremarkable and there is no significant reduction in portal vein
velocities.

Hepatic Vein Velocities

Right:  51 cm/sec

Middle:  43 cm/sec

Left:  58 cm/sec

No evidence of hepatic Jefford disease. Hepatic vein
waveforms and velocities are normal.

Hepatic Artery Velocity:  186 cm/sec

Splenic Vein Velocity: 73 cm/sec. No evidence of splenic vein
thrombosis.

Varices: No visualized varices.

Ascites: Ascites present.

The spleen is moderately enlarged with estimated volume of 602 mL.
The liver again demonstrates evidence of cirrhosis without focal
mass or evidence of intrahepatic biliary ductal dilatation.
IMPRESSION: 1. Patent portal vein with normal direction of flow and no
significant reduction in velocities.
2. Normally patent hepatic veins.
3. Moderate splenomegaly with estimated volume of 602 mL.
4. Evidence of cirrhosis without evidence of hepatic mass or
intrahepatic biliary ductal dilatation by ultrasound.
5. Ascites.

## 2020-12-12 IMAGING — US IR PARACENTESIS
1 series · 2 of 2 positions shown · non-contrast
Comparison: none

INDICATION: Ascites secondary to alcoholic cirrhosis and hepatitis C infection.
Request for diagnostic and therapeutic paracentesis.

[Series 1: ir (id) (id)/(id)/(id) ir · 2 of 2 slices shown]
[im 1/2]
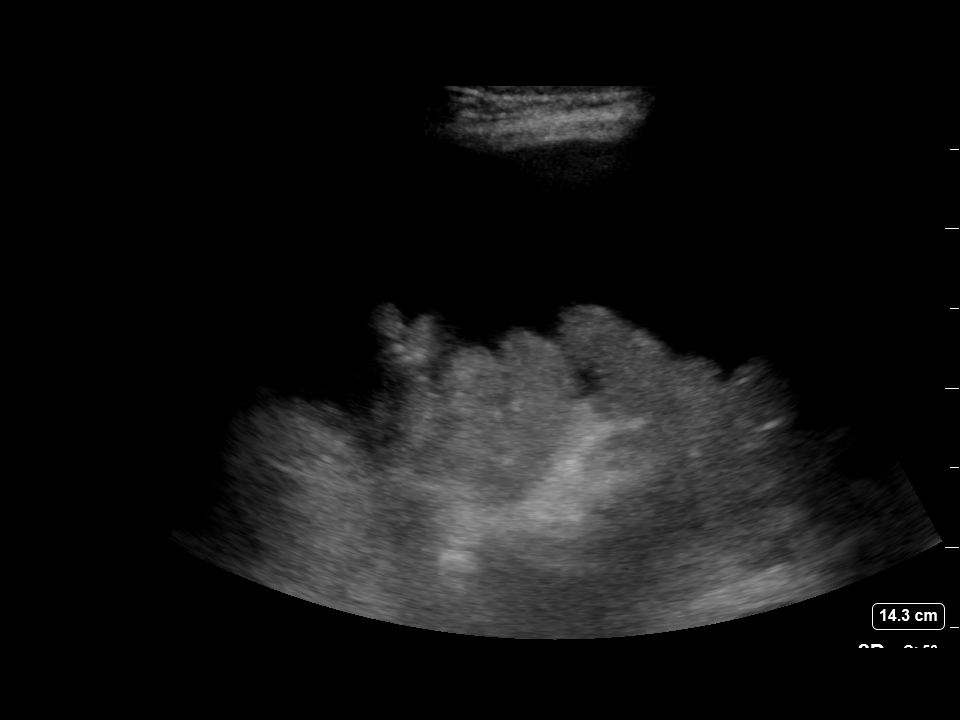
[im 2/2]
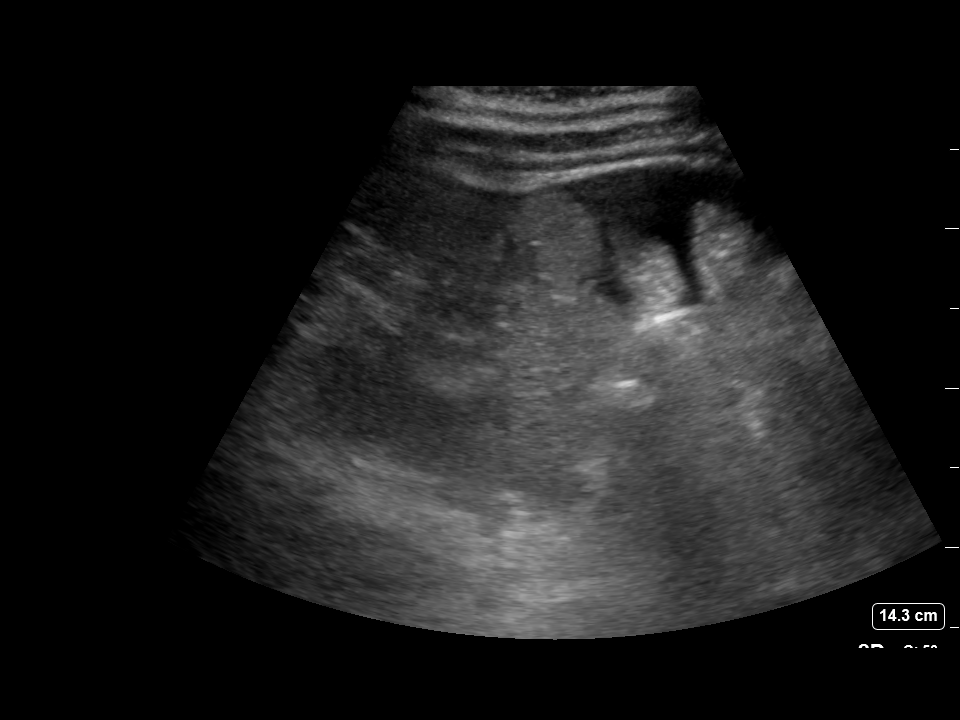

[2 of 2 positions shown; findings below may reference images not displayed]

EXAM:
ULTRASOUND GUIDED PARACENTESIS

MEDICATIONS:
1% lidocaine 10 mL

COMPLICATIONS:
None immediate.

PROCEDURE:
Informed written consent was obtained from the patient after a
discussion of the risks, benefits and alternatives to treatment. A
timeout was performed prior to the initiation of the procedure.

Initial ultrasound scanning demonstrates a large amount of ascites
within the left lower abdominal quadrant. The left lower abdomen was
prepped and draped in the usual sterile fashion. 1% lidocaine with
epinephrine was used for local anesthesia.

Following this, a 19 gauge, 7-cm, Yueh catheter was introduced. An
ultrasound image was saved for documentation purposes. The
paracentesis was performed. The catheter was removed and a dressing
was applied. The patient tolerated the procedure well without
immediate post procedural complication.
FINDINGS: A total of approximately 3.75 L of clear yellow fluid was removed.
Samples were sent to the laboratory as requested by the clinical
team.
IMPRESSION: Successful ultrasound-guided paracentesis yielding 3.75 liters of
peritoneal fluid.

## 2020-12-14 IMAGING — DX PORTABLE ABDOMEN - 1 VIEW
2 series · 2 of 2 positions shown · non-contrast
Comparison: None.

CLINICAL DATA: 47-year-old male with coffee-ground emesis.

EXAM:
PORTABLE ABDOMEN - 1 VIEW

[abdomen kub (1 of 2)]
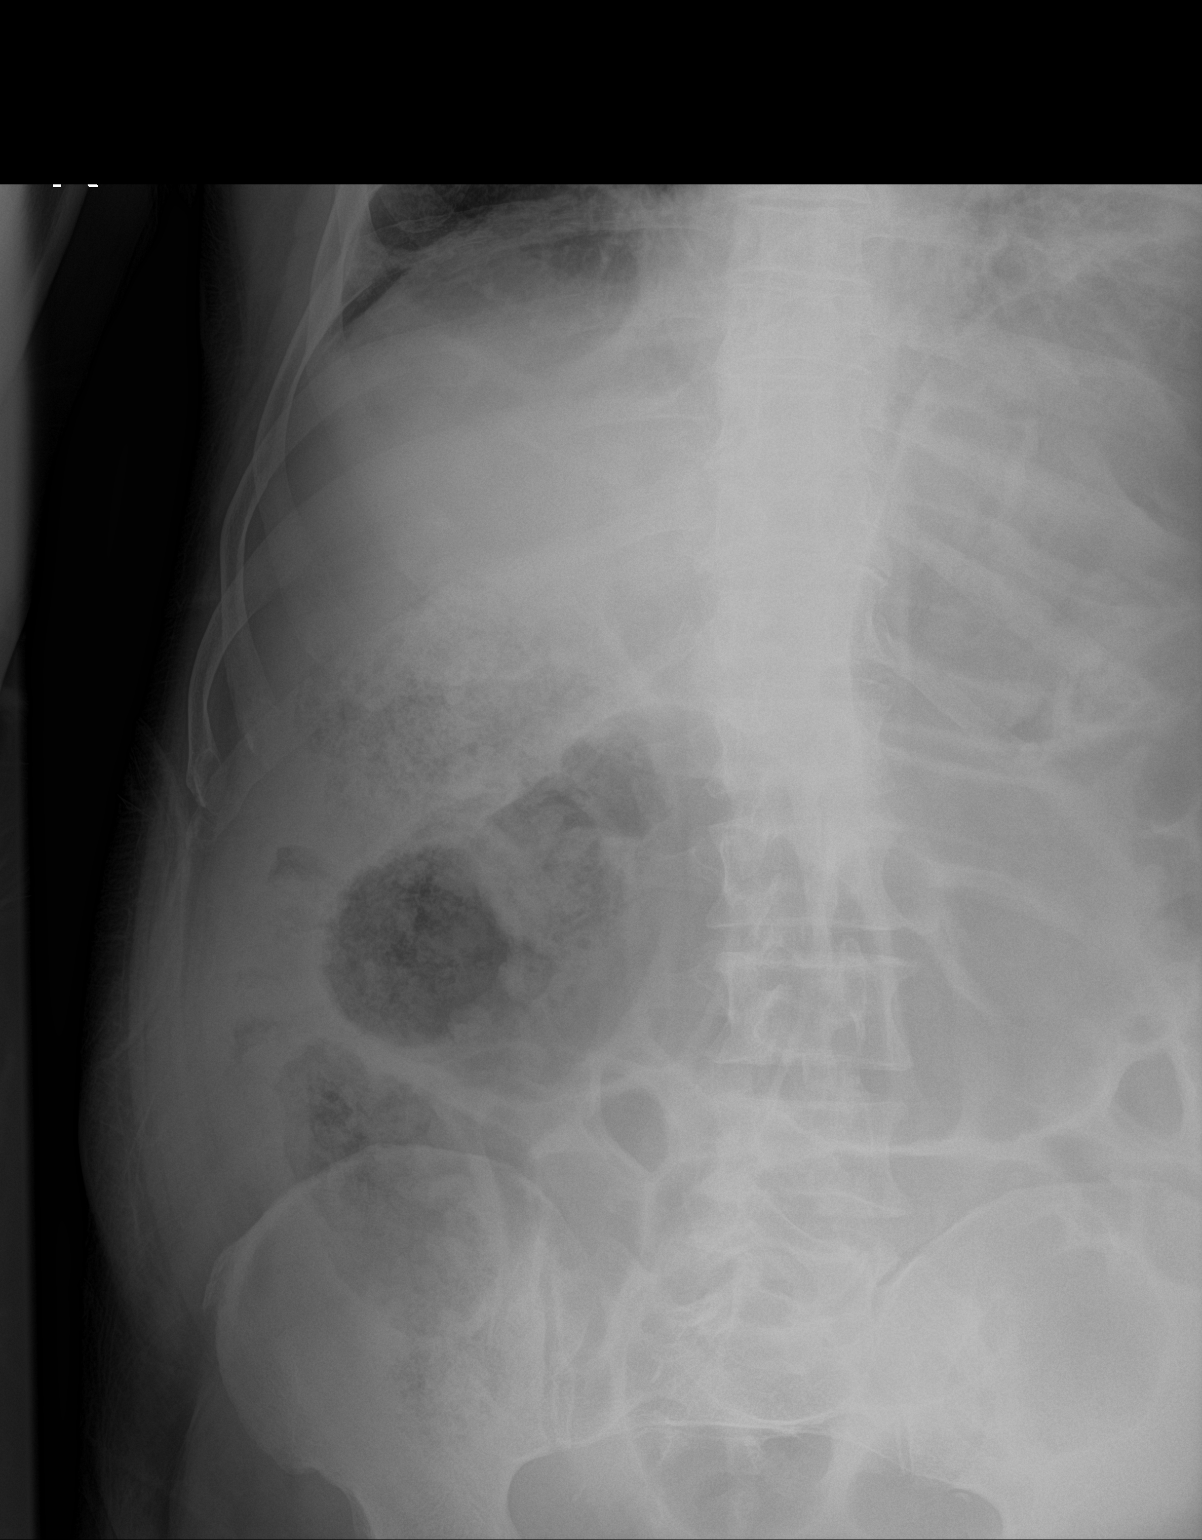

[abdomen kub (2 of 2)]
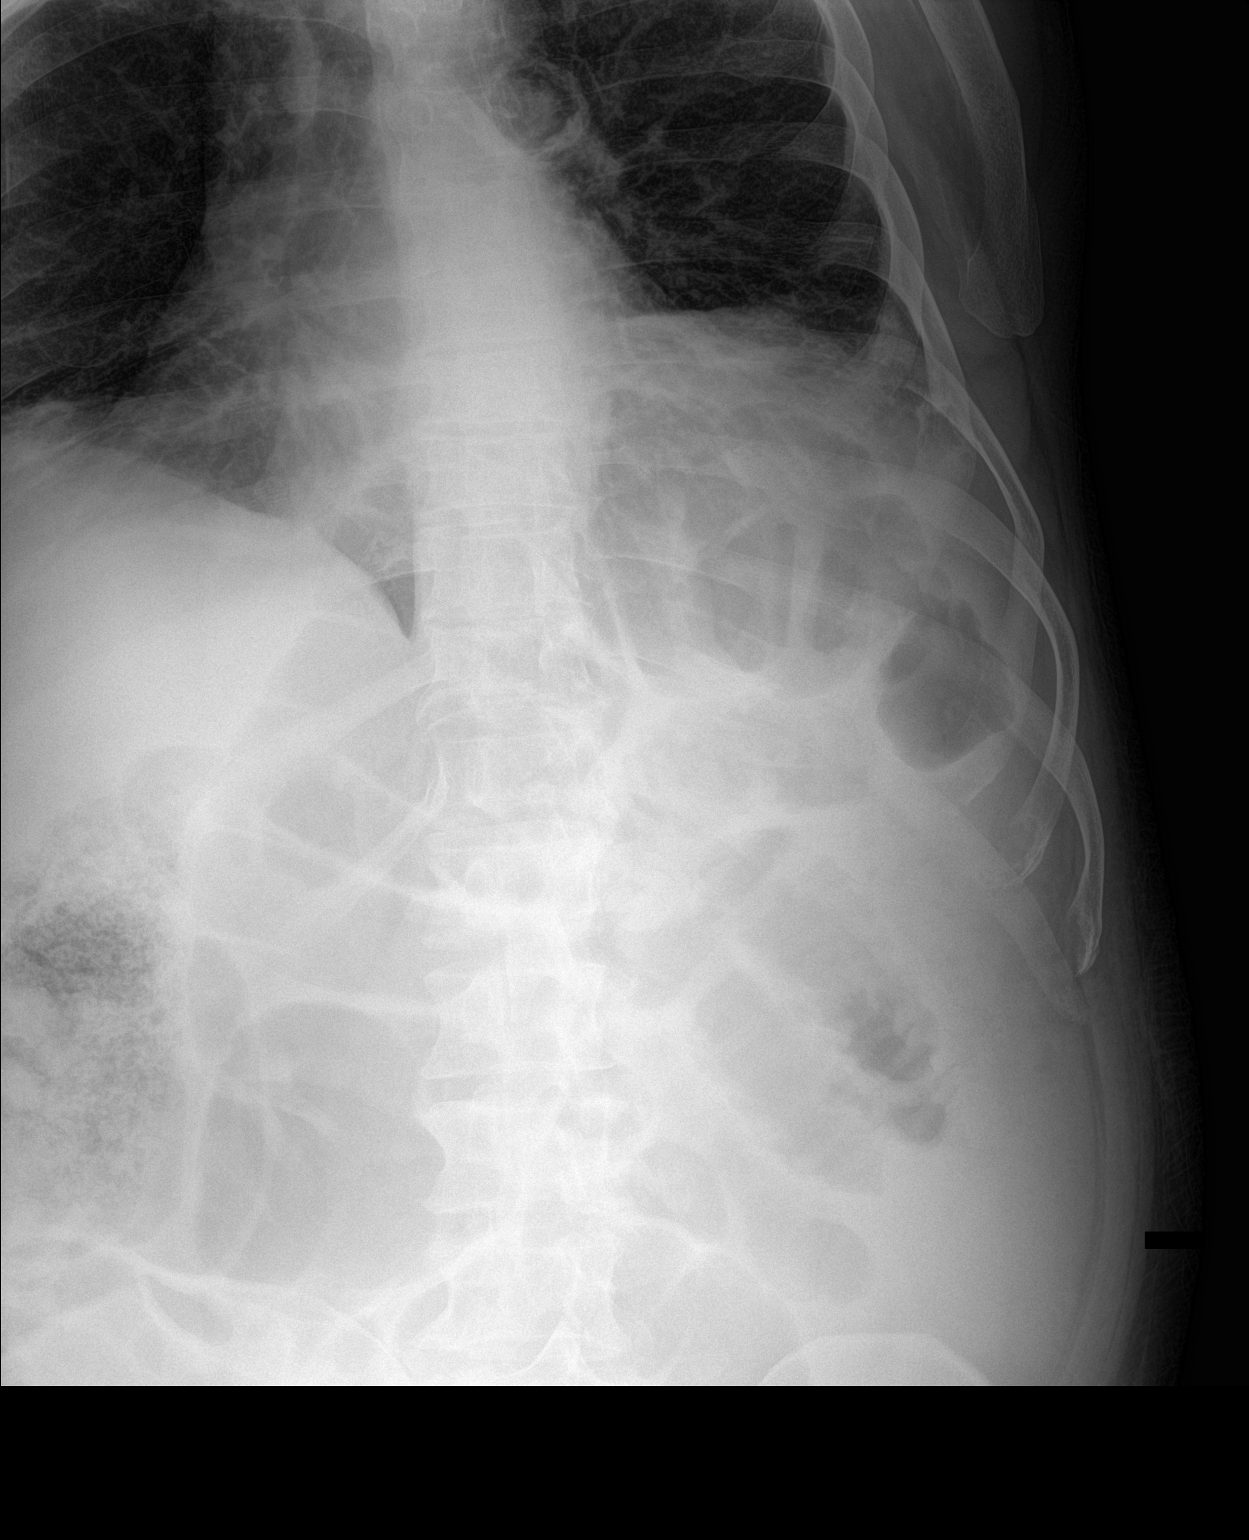

[2 of 2 positions shown; findings below may reference images not displayed]

FINDINGS: Evaluation is limited due to body habitus. There is moderate amount
of air within the colon as well as stool in the proximal colon.
Mildly dilated air-filled small bowel loops measure up to 3.4 cm in
the left hemiabdomen. No free air or radiopaque calculi. Probable
ascites. There are bibasilar atelectasis. The osseous structures are
grossly unremarkable.
IMPRESSION: Mildly dilated air-filled small bowel loops in the left hemiabdomen.
No free air.
